# Patient Record
Sex: Male | Born: 1960 | Race: White | Hispanic: No | Marital: Single | Smoking: Never smoker
Health system: Southern US, Community
[De-identification: ages and names within clinical notes are randomized; demographics above are authoritative.]

## PROBLEM LIST (undated history)

## (undated) DIAGNOSIS — I10 Essential (primary) hypertension: Secondary | ICD-10-CM

## (undated) DIAGNOSIS — E119 Type 2 diabetes mellitus without complications: Secondary | ICD-10-CM

## (undated) DIAGNOSIS — E7800 Pure hypercholesterolemia, unspecified: Secondary | ICD-10-CM

## (undated) HISTORY — PX: OTHER SURGICAL HISTORY: SHX169

---

## 2010-09-07 ENCOUNTER — Observation Stay: Payer: Self-pay | Admitting: Internal Medicine

## 2013-05-08 ENCOUNTER — Emergency Department: Payer: Self-pay | Admitting: Emergency Medicine

## 2013-05-08 LAB — BASIC METABOLIC PANEL
ANION GAP: 7 (ref 7–16)
BUN: 15 mg/dL (ref 7–18)
CO2: 27 mmol/L (ref 21–32)
Calcium, Total: 8.7 mg/dL (ref 8.5–10.1)
Chloride: 103 mmol/L (ref 98–107)
Creatinine: 1.4 mg/dL — ABNORMAL HIGH (ref 0.60–1.30)
EGFR (Non-African Amer.): 57 — ABNORMAL LOW
GLUCOSE: 181 mg/dL — AB (ref 65–99)
Osmolality: 279 (ref 275–301)
POTASSIUM: 3.9 mmol/L (ref 3.5–5.1)
SODIUM: 137 mmol/L (ref 136–145)

## 2013-05-08 LAB — TROPONIN I

## 2013-05-08 LAB — CBC WITH DIFFERENTIAL/PLATELET
Basophil #: 0.1 10*3/uL (ref 0.0–0.1)
Basophil %: 1 %
EOS ABS: 0.3 10*3/uL (ref 0.0–0.7)
Eosinophil %: 4.4 %
HCT: 45.3 % (ref 40.0–52.0)
HGB: 14.9 g/dL (ref 13.0–18.0)
LYMPHS ABS: 1.6 10*3/uL (ref 1.0–3.6)
LYMPHS PCT: 27.1 %
MCH: 29.1 pg (ref 26.0–34.0)
MCHC: 32.9 g/dL (ref 32.0–36.0)
MCV: 88 fL (ref 80–100)
MONOS PCT: 9 %
Monocyte #: 0.5 x10 3/mm (ref 0.2–1.0)
Neutrophil #: 3.4 10*3/uL (ref 1.4–6.5)
Neutrophil %: 58.5 %
PLATELETS: 194 10*3/uL (ref 150–440)
RBC: 5.12 10*6/uL (ref 4.40–5.90)
RDW: 15.3 % — ABNORMAL HIGH (ref 11.5–14.5)
WBC: 5.8 10*3/uL (ref 3.8–10.6)

## 2013-05-08 LAB — URINALYSIS, COMPLETE
Bacteria: NONE SEEN
Bilirubin,UR: NEGATIVE
Blood: NEGATIVE
Glucose,UR: NEGATIVE mg/dL (ref 0–75)
LEUKOCYTE ESTERASE: NEGATIVE
NITRITE: NEGATIVE
PH: 5 (ref 4.5–8.0)
Protein: NEGATIVE
RBC, UR: NONE SEEN /HPF (ref 0–5)
Specific Gravity: 1.023 (ref 1.003–1.030)
Squamous Epithelial: NONE SEEN
WBC UR: NONE SEEN /HPF (ref 0–5)

## 2017-03-13 ENCOUNTER — Encounter: Payer: Self-pay | Admitting: Emergency Medicine

## 2017-03-13 ENCOUNTER — Emergency Department: Payer: Self-pay

## 2017-03-13 ENCOUNTER — Other Ambulatory Visit: Payer: Self-pay

## 2017-03-13 ENCOUNTER — Observation Stay
Admission: EM | Admit: 2017-03-13 | Discharge: 2017-03-16 | Disposition: A | Discharge status: Home / Self Care | Payer: Self-pay | Attending: Internal Medicine | Admitting: Internal Medicine

## 2017-03-13 DIAGNOSIS — E1165 Type 2 diabetes mellitus with hyperglycemia: Secondary | ICD-10-CM | POA: Insufficient documentation

## 2017-03-13 DIAGNOSIS — I200 Unstable angina: Principal | ICD-10-CM | POA: Diagnosis present

## 2017-03-13 DIAGNOSIS — E7800 Pure hypercholesterolemia, unspecified: Secondary | ICD-10-CM | POA: Insufficient documentation

## 2017-03-13 DIAGNOSIS — I10 Essential (primary) hypertension: Secondary | ICD-10-CM | POA: Insufficient documentation

## 2017-03-13 DIAGNOSIS — R079 Chest pain, unspecified: Secondary | ICD-10-CM

## 2017-03-13 DIAGNOSIS — E785 Hyperlipidemia, unspecified: Secondary | ICD-10-CM | POA: Insufficient documentation

## 2017-03-13 DIAGNOSIS — Z7982 Long term (current) use of aspirin: Secondary | ICD-10-CM | POA: Insufficient documentation

## 2017-03-13 DIAGNOSIS — E860 Dehydration: Secondary | ICD-10-CM | POA: Insufficient documentation

## 2017-03-13 DIAGNOSIS — F329 Major depressive disorder, single episode, unspecified: Secondary | ICD-10-CM | POA: Insufficient documentation

## 2017-03-13 DIAGNOSIS — Z79899 Other long term (current) drug therapy: Secondary | ICD-10-CM | POA: Insufficient documentation

## 2017-03-13 DIAGNOSIS — Z794 Long term (current) use of insulin: Secondary | ICD-10-CM | POA: Insufficient documentation

## 2017-03-13 HISTORY — DX: Essential (primary) hypertension: I10

## 2017-03-13 HISTORY — DX: Type 2 diabetes mellitus without complications: E11.9

## 2017-03-13 HISTORY — DX: Pure hypercholesterolemia, unspecified: E78.00

## 2017-03-13 LAB — BASIC METABOLIC PANEL
ANION GAP: 17 — AB (ref 5–15)
BUN: 19 mg/dL (ref 6–20)
CO2: 22 mmol/L (ref 22–32)
Calcium: 9 mg/dL (ref 8.9–10.3)
Chloride: 95 mmol/L — ABNORMAL LOW (ref 101–111)
Creatinine, Ser: 1.14 mg/dL (ref 0.61–1.24)
GFR calc Af Amer: >60 mL/min (ref >60)
GFR calc non Af Amer: >60 mL/min (ref >60)
GLUCOSE: 321 mg/dL — AB (ref 65–99)
POTASSIUM: 3.8 mmol/L (ref 3.5–5.1)
Sodium: 134 mmol/L — ABNORMAL LOW (ref 135–145)

## 2017-03-13 LAB — CBC
HEMATOCRIT: 45.4 % (ref 40.0–52.0)
HEMOGLOBIN: 15.2 g/dL (ref 13.0–18.0)
MCH: 28.7 pg (ref 26.0–34.0)
MCHC: 33.5 g/dL (ref 32.0–36.0)
MCV: 85.6 fL (ref 80.0–100.0)
Platelets: 193 10*3/uL (ref 150–440)
RBC: 5.3 MIL/uL (ref 4.40–5.90)
RDW: 14.6 % — ABNORMAL HIGH (ref 11.5–14.5)
WBC: 6.4 10*3/uL (ref 3.8–10.6)

## 2017-03-13 LAB — URINALYSIS, COMPLETE (UACMP) WITH MICROSCOPIC
BACTERIA UA: NONE SEEN
Bilirubin Urine: NEGATIVE
Glucose, UA: >=500 mg/dL — AB
HGB URINE DIPSTICK: NEGATIVE
KETONES UR: 20 mg/dL — AB
LEUKOCYTES UA: NEGATIVE
Nitrite: NEGATIVE
PH: 6 (ref 5.0–8.0)
Protein, ur: NEGATIVE mg/dL
Specific Gravity, Urine: 1.032 — ABNORMAL HIGH (ref 1.005–1.030)
Squamous Epithelial / LPF: NONE SEEN

## 2017-03-13 LAB — BRAIN NATRIURETIC PEPTIDE: B NATRIURETIC PEPTIDE 5: 11 pg/mL (ref 0.0–100.0)

## 2017-03-13 LAB — BETA-HYDROXYBUTYRIC ACID: Beta-Hydroxybutyric Acid: 0.85 mmol/L — ABNORMAL HIGH (ref 0.05–0.27)

## 2017-03-13 LAB — TROPONIN I
Troponin I: <0.03 ng/mL (ref <0.03)
Troponin I: <0.03 ng/mL (ref <0.03)

## 2017-03-13 MED ORDER — IOPAMIDOL (ISOVUE-370) INJECTION 76%
75.0000 mL | Freq: Once | INTRAVENOUS | Status: AC | PRN
  Administered 2017-03-13: 75 mL via INTRAVENOUS

## 2017-03-13 MED ORDER — ONDANSETRON 4 MG PO TBDP
4.0000 mg | ORAL_TABLET | Freq: Once | ORAL | Status: AC | PRN
  Administered 2017-03-13: 4 mg via ORAL
  Filled 2017-03-13: qty 1

## 2017-03-13 MED ORDER — SODIUM CHLORIDE 0.9 % IV BOLUS (SEPSIS)
1000.0000 mL | Freq: Once | INTRAVENOUS | Status: AC
  Administered 2017-03-13: 1000 mL via INTRAVENOUS

## 2017-03-13 MED ORDER — ONDANSETRON HCL 4 MG/2ML IJ SOLN
4.0000 mg | Freq: Once | INTRAMUSCULAR | Status: AC
  Administered 2017-03-13: 4 mg via INTRAVENOUS
  Filled 2017-03-13: qty 2

## 2017-03-13 MED ORDER — MORPHINE SULFATE (PF) 4 MG/ML IV SOLN
4.0000 mg | Freq: Once | INTRAVENOUS | Status: AC
Start: 2017-03-13 — End: 2017-03-13
  Administered 2017-03-13: 4 mg via INTRAVENOUS
  Filled 2017-03-13: qty 1

## 2017-03-13 MED ORDER — MORPHINE SULFATE (PF) 4 MG/ML IV SOLN
4.0000 mg | Freq: Once | INTRAVENOUS | Status: AC
  Administered 2017-03-13: 4 mg via INTRAVENOUS
  Filled 2017-03-13: qty 1

## 2017-03-13 NOTE — ED Triage Notes (Signed)
Pt to triage via WC, reports CP and SOB x 1 day, described as pressure, w/ dizziness, nausea.  Pt report hx of HTN, DM, and high cholesterol,.

## 2017-03-13 NOTE — ED Provider Notes (Signed)
[facility] Emergency Department Provider Note  ____________________________________________   First MD Initiated Contact with Patient 03/13/17 1902     (approximate)  I have reviewed the triage vital signs and the nursing notes.   HISTORY  Chief Complaint Chest Pain   HPI Randy Murray is a 57 y.o. male with a history of diabetes as well as hypertension was presenting to the emergency department today with chest pain that he says started suddenly at 2 PM this afternoon.  He says the pain is a 10 out of 10 and across the front of his chest and feels like a pressure.  He denies any radiation of the pain.  Says that it is associated with shortness of breath.  Says that the pain is been constant.  Not worsened with deep breathing.  Says that he is also been out of his insulin lately.  Denies any cardiac disease.  Patient denies fever.  Says that he does feel like he has a sore throat.  Does not report body aches.   Past Medical History:  Diagnosis Date  . Diabetes mellitus without complication (HCC)   . Hypercholesterolemia   . Hypertension     There are no active problems to display for this patient.   History reviewed. No pertinent surgical history.  Prior to Admission medications   Medication Sig Start Date End Date Taking? Authorizing Provider  aspirin EC 81 MG tablet Take 81 mg by mouth. 12/24/15  Yes [provider]  omeprazole (PRILOSEC) 40 MG capsule Take 40 mg by mouth daily. 02/20/17  Yes [provider]  CYMBALTA 60 MG capsule Take 60 mg by mouth daily. 01/14/17   [provider]  gabapentin (NEURONTIN) 800 MG tablet Take 800 mg by mouth 3 (three) times daily. 01/14/17   [provider]  hydrochlorothiazide (HYDRODIURIL) 25 MG tablet Take 25 mg by mouth daily. 01/14/17   [provider]  hydrOXYzine (ATARAX/VISTARIL) 25 MG tablet Take 1 tablet by mouth every 6 (six) hours as needed. 01/14/17    [provider]  losartan (COZAAR) 25 MG tablet Take 25 mg by mouth daily. 01/14/17   [provider]  lovastatin (MEVACOR) 20 MG tablet Take 1 tablet by mouth daily. 01/14/17   [provider]  metFORMIN (GLUCOPHAGE) 1000 MG tablet Take 1,000 mg by mouth 2 (two) times daily with a meal. 01/14/17   [provider]  PROVENTIL HFA 108 (90 Base) MCG/ACT inhaler Inhale 2 puffs into the lungs every 6 (six) hours as needed. 02/24/17   [provider]    Allergies Lisinopril  History reviewed. No pertinent family history.  Social History Social History   Tobacco Use  . Smoking status: Never Smoker  . Smokeless tobacco: Never Used  Substance Use Topics  . Alcohol use: Yes  . Drug use: Not on file    Review of Systems  Constitutional: No fever/chills Eyes: No visual changes. ENT: No sore throat. Cardiovascular: As above Respiratory: As above Gastrointestinal: No abdominal pain.  No nausea, no vomiting.  No diarrhea.  No constipation. Genitourinary: Negative for dysuria. Musculoskeletal: Negative for back pain. Skin: Negative for rash. Neurological: Negative for headaches, focal weakness or numbness.   ____________________________________________   PHYSICAL EXAM:  VITAL SIGNS: ED Triage Vitals  Enc Vitals Group     BP 03/13/17 1559 (!) 156/85     Pulse Rate 03/13/17 1559 (!) 124     Resp 03/13/17 1559 20     Temp  03/13/17 1559 97.6 F (36.4 C)     Temp Source 03/13/17 1559 Oral     SpO2 03/13/17 1559 100 %     Weight 03/13/17 1600 283 lb (128.4 kg)     Height 03/13/17 1600 6\' 1"  (1.854 m)     Head Circumference --      Peak Flow --      Pain Score 03/13/17 1559 10     Pain Loc --      Pain Edu? --      Excl. in GC? --     Constitutional: Alert and oriented.  Patient appears uncomfortable. Eyes: Conjunctivae are normal.  Head: Atraumatic. Nose: No congestion/rhinnorhea. Mouth/Throat: Mucous membranes are moist.  No  erythema to the throat.  No tonsillar exudates. Neck: No stridor.   Cardiovascular: Tachycardic, regular rhythm. Grossly normal heart sounds.  Good peripheral circulation. Respiratory: Normal respiratory effort.  No retractions. Lungs CTAB. Gastrointestinal: Soft and nontender. No distention. No CVA tenderness. Musculoskeletal: No lower extremity tenderness nor edema.   Neurologic:  Normal speech and language. No gross focal neurologic deficits are appreciated. Skin:  Skin is warm, dry and intact. No rash noted. Psychiatric: Mood and affect are normal. Speech and behavior are normal.  ____________________________________________   LABS (all labs ordered are listed, but only abnormal results are displayed)  Labs Reviewed  BASIC METABOLIC PANEL - Abnormal; Notable for the following components:      Result Value   Sodium 134 (*)    Chloride 95 (*)    Glucose, Bld 321 (*)    Anion gap 17 (*)    All other components within normal limits  CBC - Abnormal; Notable for the following components:   RDW 14.6 (*)    All other components within normal limits  BETA-HYDROXYBUTYRIC ACID - Abnormal; Notable for the following components:   Beta-Hydroxybutyric Acid 0.85 (*)    All other components within normal limits  URINALYSIS, COMPLETE (UACMP) WITH MICROSCOPIC - Abnormal; Notable for the following components:   Color, Urine YELLOW (*)    APPearance CLEAR (*)    Specific Gravity, Urine 1.032 (*)    Glucose, UA >=500 (*)    Ketones, ur 20 (*)    All other components within normal limits  TROPONIN I  BRAIN NATRIURETIC PEPTIDE  TROPONIN I   ____________________________________________  EKG  ED ECG REPORT I, Arelia Longest, the attending physician, personally viewed and interpreted this ECG.   Date: 03/13/2017  EKG Time: 1557  Rate: 129  Rhythm: sinus tachycardia  Axis: normal  Intervals:none  ST&T Change: No ST segment elevation or depression.  No abnormal T wave  inversion.  ____________________________________________  RADIOLOGY  No acute disease on the chest x-ray.  Mild changes of interstitial pulmonary edema on the CT angiography. ____________________________________________   PROCEDURES  Procedure(s) performed:   Procedures  Critical Care performed:   ____________________________________________   INITIAL IMPRESSION / ASSESSMENT AND PLAN / ED COURSE  Pertinent labs & imaging results that were available during my care of the patient were reviewed by me and considered in my medical decision making (see chart for details).  Differential diagnosis includes, but is not limited to, ACS, aortic dissection, pulmonary embolism, cardiac tamponade, pneumothorax, pneumonia, pericarditis, myocarditis, GI-related causes including esophagitis/gastritis, and musculoskeletal chest wall pain.   As part of my medical decision making, I reviewed the following data within the electronic MEDICAL RECORD NUMBER Notes from prior ED visits  ----------------------------------------- 10 PM on 03/13/2017 ----------------------------------------- Patient pending lab  work at this time.  CT angiography reassuring without evidence of PE.  Possible diagnoses to include diabetic ketoacidosis.  Cardiac related chest pain.  Signed out to Dr. Cyril Loosen.        ____________________________________________   FINAL CLINICAL IMPRESSION(S) / ED DIAGNOSES  Chest pain.  Hyperglycemia.    NEW MEDICATIONS STARTED DURING THIS VISIT:  New Prescriptions   No medications on file     Note:  This document was prepared using Dragon voice recognition software and may include unintentional dictation errors.     Myrna Blazer, MD 03/13/17 [phone]

## 2017-03-14 ENCOUNTER — Observation Stay
Admit: 2017-03-14 | Discharge: 2017-03-14 | Disposition: A | Discharge status: Home / Self Care | Payer: Self-pay | Attending: Internal Medicine | Admitting: Internal Medicine

## 2017-03-14 ENCOUNTER — Encounter: Payer: Self-pay | Admitting: Internal Medicine

## 2017-03-14 ENCOUNTER — Other Ambulatory Visit: Payer: Self-pay

## 2017-03-14 DIAGNOSIS — I200 Unstable angina: Secondary | ICD-10-CM

## 2017-03-14 LAB — TROPONIN I: Troponin I: <0.03 ng/mL (ref <0.03)

## 2017-03-14 LAB — ECHOCARDIOGRAM COMPLETE
HEIGHTINCHES: 73 in
Weight: 4358.4 oz

## 2017-03-14 LAB — GLUCOSE, CAPILLARY
GLUCOSE-CAPILLARY: 261 mg/dL — AB (ref 65–99)
GLUCOSE-CAPILLARY: 258 mg/dL — AB (ref 65–99)
Glucose-Capillary: 229 mg/dL — ABNORMAL HIGH (ref 65–99)
Glucose-Capillary: 311 mg/dL — ABNORMAL HIGH (ref 65–99)
Glucose-Capillary: 250 mg/dL — ABNORMAL HIGH (ref 65–99)

## 2017-03-14 MED ORDER — ASPIRIN 81 MG PO CHEW
162.0000 mg | CHEWABLE_TABLET | Freq: Once | ORAL | Status: AC
Start: 2017-03-14 — End: 2017-03-14
  Administered 2017-03-14: 162 mg via ORAL
  Filled 2017-03-14: qty 2

## 2017-03-14 MED ORDER — ONDANSETRON HCL 4 MG/2ML IJ SOLN: 4.0000 mg | Freq: Four times a day (QID) | INTRAMUSCULAR | Status: DC | PRN

## 2017-03-14 MED ORDER — METFORMIN HCL 500 MG PO TABS
1000.0000 mg | ORAL_TABLET | Freq: Two times a day (BID) | ORAL | Status: DC
  Administered 2017-03-14 – 2017-03-16 (×5): 1000 mg via ORAL
  Filled 2017-03-14 (×5): qty 2

## 2017-03-14 MED ORDER — ENOXAPARIN SODIUM 40 MG/0.4ML ~~LOC~~ SOLN
40.0000 mg | SUBCUTANEOUS | Status: DC
  Administered 2017-03-14 – 2017-03-16 (×3): 40 mg via SUBCUTANEOUS
  Filled 2017-03-14 (×3): qty 0.4

## 2017-03-14 MED ORDER — ACETAMINOPHEN 325 MG PO TABS
650.0000 mg | ORAL_TABLET | ORAL | Status: DC | PRN
  Administered 2017-03-14: 650 mg via ORAL
  Filled 2017-03-14: qty 2

## 2017-03-14 MED ORDER — PRAVASTATIN SODIUM 20 MG PO TABS
20.0000 mg | ORAL_TABLET | Freq: Every day | ORAL | Status: DC
  Administered 2017-03-14 – 2017-03-15 (×2): 20 mg via ORAL
  Filled 2017-03-14 (×2): qty 1

## 2017-03-14 MED ORDER — SODIUM CHLORIDE 0.9 % IV SOLN
INTRAVENOUS | Status: DC
  Administered 2017-03-14: 03:00:00 via INTRAVENOUS

## 2017-03-14 MED ORDER — LOSARTAN POTASSIUM 25 MG PO TABS
25.0000 mg | ORAL_TABLET | Freq: Every day | ORAL | Status: DC
  Administered 2017-03-14 – 2017-03-16 (×3): 25 mg via ORAL
  Filled 2017-03-14 (×3): qty 1

## 2017-03-14 MED ORDER — PANTOPRAZOLE SODIUM 40 MG PO TBEC
40.0000 mg | DELAYED_RELEASE_TABLET | Freq: Every day | ORAL | Status: DC
  Administered 2017-03-14 – 2017-03-16 (×3): 40 mg via ORAL
  Filled 2017-03-14 (×3): qty 1

## 2017-03-14 MED ORDER — ASPIRIN 81 MG PO CHEW: 324.0000 mg | CHEWABLE_TABLET | ORAL | Status: AC

## 2017-03-14 MED ORDER — ASPIRIN EC 81 MG PO TBEC
81.0000 mg | DELAYED_RELEASE_TABLET | Freq: Every day | ORAL | Status: DC
  Filled 2017-03-14: qty 1

## 2017-03-14 MED ORDER — ALBUTEROL SULFATE (2.5 MG/3ML) 0.083% IN NEBU: 2.5000 mg | INHALATION_SOLUTION | Freq: Four times a day (QID) | RESPIRATORY_TRACT | Status: DC | PRN

## 2017-03-14 MED ORDER — HYDROXYZINE HCL 25 MG PO TABS
25.0000 mg | ORAL_TABLET | Freq: Four times a day (QID) | ORAL | Status: DC | PRN
  Administered 2017-03-14 – 2017-03-16 (×5): 25 mg via ORAL
  Filled 2017-03-14 (×5): qty 1

## 2017-03-14 MED ORDER — ASPIRIN 300 MG RE SUPP: 300.0000 mg | RECTAL | Status: AC

## 2017-03-14 MED ORDER — INSULIN ASPART 100 UNIT/ML ~~LOC~~ SOLN
0.0000 [IU] | Freq: Three times a day (TID) | SUBCUTANEOUS | Status: DC
  Administered 2017-03-14: 11 [IU] via SUBCUTANEOUS
  Administered 2017-03-14: 15 [IU] via SUBCUTANEOUS
  Administered 2017-03-14: 7 [IU] via SUBCUTANEOUS
  Administered 2017-03-15: 11 [IU] via SUBCUTANEOUS
  Administered 2017-03-15 – 2017-03-16 (×3): 15 [IU] via SUBCUTANEOUS
  Filled 2017-03-14 (×7): qty 1

## 2017-03-14 MED ORDER — GABAPENTIN 400 MG PO CAPS
800.0000 mg | ORAL_CAPSULE | Freq: Three times a day (TID) | ORAL | Status: DC
  Administered 2017-03-14 – 2017-03-16 (×7): 800 mg via ORAL
  Filled 2017-03-14 (×7): qty 2

## 2017-03-14 MED ORDER — MORPHINE SULFATE (PF) 2 MG/ML IV SOLN
2.0000 mg | INTRAVENOUS | Status: DC | PRN
  Administered 2017-03-14 – 2017-03-16 (×13): 2 mg via INTRAVENOUS
  Filled 2017-03-14 (×13): qty 1

## 2017-03-14 MED ORDER — DULOXETINE HCL 30 MG PO CPEP
60.0000 mg | ORAL_CAPSULE | Freq: Every day | ORAL | Status: DC
  Administered 2017-03-14 – 2017-03-16 (×3): 60 mg via ORAL
  Filled 2017-03-14 (×3): qty 2

## 2017-03-14 MED ORDER — GABAPENTIN 800 MG PO TABS
800.0000 mg | ORAL_TABLET | Freq: Three times a day (TID) | ORAL | Status: DC
  Filled 2017-03-14 (×2): qty 1

## 2017-03-14 MED ORDER — INSULIN ASPART 100 UNIT/ML ~~LOC~~ SOLN
0.0000 [IU] | Freq: Every day | SUBCUTANEOUS | Status: DC
  Administered 2017-03-14: 2 [IU] via SUBCUTANEOUS
  Administered 2017-03-14: 3 [IU] via SUBCUTANEOUS
  Administered 2017-03-15: 2 [IU] via SUBCUTANEOUS
  Filled 2017-03-14 (×3): qty 1

## 2017-03-14 MED ORDER — INSULIN ASPART 100 UNIT/ML ~~LOC~~ SOLN
3.0000 [IU] | Freq: Three times a day (TID) | SUBCUTANEOUS | Status: DC
  Administered 2017-03-14 – 2017-03-16 (×7): 3 [IU] via SUBCUTANEOUS
  Filled 2017-03-14 (×7): qty 1

## 2017-03-14 MED ORDER — ASPIRIN EC 81 MG PO TBEC
81.0000 mg | DELAYED_RELEASE_TABLET | Freq: Every day | ORAL | Status: DC
  Administered 2017-03-14 – 2017-03-16 (×3): 81 mg via ORAL
  Filled 2017-03-14 (×3): qty 1

## 2017-03-14 MED ORDER — PERFLUTREN LIPID MICROSPHERE
1.0000 mL | INTRAVENOUS | Status: AC | PRN
  Administered 2017-03-14: 6 mL via INTRAVENOUS
  Filled 2017-03-14: qty 10

## 2017-03-14 MED ORDER — NITROGLYCERIN 0.4 MG SL SUBL
0.4000 mg | SUBLINGUAL_TABLET | SUBLINGUAL | Status: DC | PRN
  Administered 2017-03-14: 0.4 mg via SUBLINGUAL
  Filled 2017-03-14 (×2): qty 1

## 2017-03-14 MED ORDER — NITROGLYCERIN 0.4 MG SL SUBL
0.4000 mg | SUBLINGUAL_TABLET | SUBLINGUAL | Status: DC | PRN
  Administered 2017-03-14: 0.4 mg via SUBLINGUAL

## 2017-03-14 NOTE — Plan of Care (Signed)
  Progressing Education: Knowledge of General Education information will improve 03/14/2017 0646 - Progressing by Stefan Churchogers, Masin Shatto M, RN Health Behavior/Discharge Planning: Ability to manage health-related needs will improve 03/14/2017 0646 - Progressing by Stefan Churchogers, Velmer Woelfel M, RN Clinical Measurements: Ability to maintain clinical measurements within normal limits will improve 03/14/2017 0646 - Progressing by Stefan Churchogers, Cesar Alf M, RN Diagnostic test results will improve 03/14/2017 [phone] - Progressing by Stefan Churchogers, Deira Shimer M, RN Respiratory complications will improve 03/14/2017 0646 - Progressing by Stefan Churchogers, Tanazia Achee M, RN Cardiovascular complication will be avoided 03/14/2017 0646 - Progressing by Stefan Churchogers, Derisha Funderburke M, RN Pain Managment: General experience of comfort will improve 03/14/2017 0646 - Progressing by Stefan Churchogers, Terika Pillard M, RN

## 2017-03-14 NOTE — Progress Notes (Signed)
*  PRELIMINARY RESULTS* Echocardiogram 2D Echocardiogram has been performed. Definity IV Contrast used on this study.  Garrel Ridgelikeshia S Casanova Schurman 03/14/2017, 11:40 AM

## 2017-03-14 NOTE — H&P (Signed)
[facility] [place] [facility]   PATIENT NAME: Randy LukeJeffery Murray    MR#:  [id number]  DATE OF BIRTH:  10/29/1960  DATE OF ADMISSION:  03/13/2017  PRIMARY CARE PHYSICIAN: System, Pcp Not In   REQUESTING/REFERRING PHYSICIAN:   CHIEF COMPLAINT:   Chief Complaint  Patient presents with  . Chest Pain    HISTORY OF PRESENT ILLNESS: Randy Murray  is a 57 y.o. male with a known history of hypertension, hyperlipidemia, type 2 diabetes mellitus presented to the emergency room with chest pain since yesterday evening.  Patient had initially chest pain in the left side of the chest , then later has pain in the whole of the chest.  Pain is 7 out of 10 on a scale of 1-10 and sharp in nature.  Troponin has been negative in the emergency room.  Patient also ran out of his insulin medications for diabetes since Christmas.  His blood sugar has been elevated.  No complaints of any shortness of breath, orthopnea.  No headaches dizziness and blurry vision.Marland Kitchen.  Hospitalist service was consulted for further care.  PAST MEDICAL HISTORY:   Past Medical History:  Diagnosis Date  . Diabetes mellitus without complication (HCC)   . Hypercholesterolemia   . Hypertension     PAST SURGICAL HISTORY:  Past Surgical History:  Procedure Laterality Date  . none      SOCIAL HISTORY:  Social History   Tobacco Use  . Smoking status: Never Smoker  . Smokeless tobacco: Never Used  Substance Use Topics  . Alcohol use: Yes    FAMILY HISTORY:  Family History  Problem Relation Age of Onset  . Thyroid cancer Mother   . Breast cancer Mother   . Esophageal cancer Father     DRUG ALLERGIES:  Allergies  Allergen Reactions  . Lisinopril     REVIEW OF SYSTEMS:   CONSTITUTIONAL: No fever, fatigue or weakness.  EYES: No blurred or double vision.  EARS, NOSE, AND THROAT: No tinnitus or ear pain.  RESPIRATORY: No cough, shortness of breath, wheezing or hemoptysis.  CARDIOVASCULAR:  Has chest pain,  No orthopnea, edema.  GASTROINTESTINAL: No nausea, vomiting, diarrhea or abdominal pain.  GENITOURINARY: No dysuria, hematuria.  ENDOCRINE: Has polyuria, nocturia,  HEMATOLOGY: No anemia, easy bruising or bleeding SKIN: No rash or lesion. MUSCULOSKELETAL: No joint pain or arthritis.   NEUROLOGIC: No tingling, numbness, weakness.  PSYCHIATRY: No anxiety or depression.   MEDICATIONS AT HOME:  Prior to Admission medications   Medication Sig Start Date End Date Taking? Authorizing Provider  aspirin EC 81 MG tablet Take 81 mg by mouth. 12/24/15  Yes [provider]  CYMBALTA 60 MG capsule Take 60 mg by mouth daily. 01/14/17  Yes [provider]  fluticasone (FLOVENT HFA) 110 MCG/ACT inhaler Inhale 2 puffs into the lungs 2 (two) times daily as needed.   Yes [provider]  gabapentin (NEURONTIN) 800 MG tablet Take 800 mg by mouth 3 (three) times daily. 01/14/17  Yes [provider]  glimepiride (AMARYL) 4 MG tablet Take 4 mg by mouth 2 (two) times daily. 09/21/16  Yes [provider]  hydrochlorothiazide (HYDRODIURIL) 25 MG tablet Take 25 mg by mouth daily. 01/14/17  Yes [provider]  hydrOXYzine (ATARAX/VISTARIL) 25 MG tablet Take 1 tablet by mouth every 6 (six) hours as needed. 01/14/17  Yes [provider]  Insulin Glargine (LANTUS SOLOSTAR) 100 UNIT/ML Solostar Pen Inject 65 Units into the skin 2 (two) times  daily. 02/25/17 04/26/17 Yes [provider]  losartan (COZAAR) 25 MG tablet Take 25 mg by mouth daily. 01/14/17  Yes [provider]  lovastatin (MEVACOR) 20 MG tablet Take 1 tablet by mouth daily. 01/14/17  Yes [provider]  metFORMIN (GLUCOPHAGE) 1000 MG tablet Take 1,000 mg by mouth 2 (two) times daily with a meal. 01/14/17  Yes [provider]  omeprazole (PRILOSEC) 40 MG capsule Take 40 mg by mouth daily. 02/20/17  Yes [provider]  PROVENTIL HFA 108 (90 Base)  MCG/ACT inhaler Inhale 2 puffs into the lungs every 6 (six) hours as needed. 02/24/17  Yes [provider]  traZODone (DESYREL) 50 MG tablet Take 50 mg by mouth at bedtime as needed for sleep.   Yes [provider]      PHYSICAL EXAMINATION:   VITAL SIGNS: Blood pressure 115/67, pulse 96, temperature 97.6 F (36.4 C), temperature source Oral, resp. rate 15, height 6\' 1"  (1.854 m), weight 128.4 kg (283 lb), SpO2 94 %.  GENERAL:  57 y.o.-year-old patient lying in the bed with no acute distress.  EYES: Pupils equal, round, reactive to light and accommodation. No scleral icterus. Extraocular muscles intact.  HEENT: Head atraumatic, normocephalic. Oropharynx dry and nasopharynx clear.  NECK:  Supple, no jugular venous distention. No thyroid enlargement, no tenderness.  LUNGS: Normal breath sounds bilaterally, no wheezing, rales,rhonchi or crepitation. No use of accessory muscles of respiration.  CARDIOVASCULAR: S1, S2 normal. No murmurs, rubs, or gallops.  ABDOMEN: Soft, nontender, nondistended. Bowel sounds present. No organomegaly or mass.  EXTREMITIES: No pedal edema, cyanosis, or clubbing.  NEUROLOGIC: Cranial nerves II through XII are intact. Muscle strength 5/5 in all extremities. Sensation intact. Gait not checked.  PSYCHIATRIC: The patient is alert and oriented x 3.  SKIN: No obvious rash, lesion, or ulcer.   LABORATORY PANEL:   CBC Recent Labs  Lab 03/13/17 1602  WBC 6.4  HGB 15.2  HCT 45.4  PLT 193  MCV 85.6  MCH 28.7  MCHC 33.5  RDW 14.6*   ------------------------------------------------------------------------------------------------------------------  Chemistries  Recent Labs  Lab 03/13/17 1602  NA 134*  K 3.8  CL 95*  CO2 22  GLUCOSE 321*  BUN 19  CREATININE 1.14  CALCIUM 9.0   ------------------------------------------------------------------------------------------------------------------ estimated creatinine clearance is 99.2 mL/min  (by C-G formula based on SCr of 1.14 mg/dL). ------------------------------------------------------------------------------------------------------------------ No results for input(s): TSH, T4TOTAL, T3FREE, THYROIDAB in the last 72 hours.  Invalid input(s): FREET3   Coagulation profile No results for input(s): INR, PROTIME in the last 168 hours. ------------------------------------------------------------------------------------------------------------------- No results for input(s): DDIMER in the last 72 hours. -------------------------------------------------------------------------------------------------------------------  Cardiac Enzymes Recent Labs  Lab 03/13/17 1602 03/13/17 2223  TROPONINI <0.03 <0.03   ------------------------------------------------------------------------------------------------------------------ Invalid input(s): POCBNP  ---------------------------------------------------------------------------------------------------------------  Urinalysis    Component Value Date/Time   COLORURINE YELLOW (A) 03/13/2017 2129   APPEARANCEUR CLEAR (A) 03/13/2017 2129   APPEARANCEUR Clear 05/08/2013 2059   LABSPEC 1.032 (H) 03/13/2017 2129   LABSPEC 1.023 05/08/2013 2059   PHURINE 6.0 03/13/2017 2129   GLUCOSEU >=500 (A) 03/13/2017 2129   GLUCOSEU Negative 05/08/2013 2059   HGBUR NEGATIVE 03/13/2017 2129   BILIRUBINUR NEGATIVE 03/13/2017 2129   BILIRUBINUR Negative 05/08/2013 2059   KETONESUR 20 (A) 03/13/2017 2129   PROTEINUR NEGATIVE 03/13/2017 2129   NITRITE NEGATIVE 03/13/2017 2129   LEUKOCYTESUR NEGATIVE 03/13/2017 2129   LEUKOCYTESUR Negative 05/08/2013 2059     RADIOLOGY: Dg Chest 2 View  Result Date: 03/13/2017 CLINICAL DATA:  Chest  pain and shortness of breath. EXAM: CHEST  2 VIEW COMPARISON:  11/09/2013 FINDINGS: Cardiomediastinal silhouette is normal. Mediastinal contours appear intact. There is no evidence of focal airspace consolidation,  pleural effusion or pneumothorax. Osseous structures are without acute abnormality. Soft tissues are grossly normal. IMPRESSION: No active cardiopulmonary disease. Electronically Signed   By: Ted Mcalpine M.D.   On: 03/13/2017 17:50   Ct Angio Chest Pe W And/or Wo Contrast  Result Date: 03/13/2017 CLINICAL DATA:  CP and SOB x 1 day, described as pressure, w/ dizziness, nausea. Pt report hx of HTN, DM, and high cholesterol, EXAM: CT ANGIOGRAPHY CHEST WITH CONTRAST TECHNIQUE: Multidetector CT imaging of the chest was performed using the standard protocol during bolus administration of intravenous contrast. Multiplanar CT image reconstructions and MIPs were obtained to evaluate the vascular anatomy. CONTRAST:  75mL ISOVUE-370 IOPAMIDOL (ISOVUE-370) INJECTION 76% COMPARISON:  03/13/2017 chest x-ray and 06/19/2013 chest CT FINDINGS: Cardiovascular: Satisfactory opacification of the pulmonary arteries to the segmental level. No evidence of pulmonary embolism. Normal heart size. No pericardial effusion. Mediastinum/Nodes: Thyroid is normal in appearance. No mediastinal, hilar, or axillary adenopathy. Esophagus is normal in appearance. Lungs/Pleura: No consolidations, or pleural effusions. There is mild smooth septal thickening distributed along the dependent aspects of the lung, and associated with mild parenchymal ground-glass density, consistent with mild pulmonary edema. Small calcified granulomata are identified within the lungs. Upper Abdomen: The liver is diffusely low attenuation. Prominent caudate lobe. A left renal cyst is partially imaged. Gallbladder is present. There is a 1.9 centimeter benign lipoma within the neck of the pancreas. Musculoskeletal: Significant midthoracic degenerative changes. No evidence for acute abnormality. Review of the MIP images confirms the above findings. IMPRESSION: 1. Mild changes of interstitial pulmonary edema without pleural effusions or alveolar infiltrates. 2.  Technically adequate exam showing no acute pulmonary embolus. 3. Hepatic steatosis. Morphology of the liver compatible with cirrhosis. 4. Left renal cyst. 5. Benign pancreatic lipoma. Electronically Signed   By: Norva Pavlov M.D.   On: 03/13/2017 21:24    EKG: Orders placed or performed during the hospital encounter of 03/13/17  . EKG 12-Lead  . EKG 12-Lead  . ED EKG within 10 minutes  . ED EKG within 10 minutes    IMPRESSION AND PLAN: 57 year old male patient with history of diabetes mellitus type 2, hypertension, hyperlipidemia presented to the emergency room with chest pain.  Admitting diagnosis 1.  Unstable angina 2.  Uncontrolled diabetes mellitus 3.  Dehydration 4.  Hypertension 5.  Hyperlipidemia 6.  Hepatic steatosis Treatment plan Admit patient to telemetry observation bed Start patient on aspirin as needed nitrates for chest pain IV fluids Sliding scale coverage for diabetes mellitus Cardiac stress test to rule out ischemia Cycle troponin and check echocardiogram Cardiology consultation   All the records are reviewed and case discussed with ED provider. Management plans discussed with the patient, family and they are in agreement.  CODE STATUS: Full code Code Status History    This patient does not have a recorded code status. Please follow your organizational policy for patients in this situation.       TOTAL TIME TAKING CARE OF THIS PATIENT:53 minutes.    Ihor Austin M.D on 03/14/2017 at 12:53 AM  Between 7am to 6pm - Pager - [phone]  After 6pm go to [email or web address] - password [email or web address]  [facility]  Office  [phone]  CC: Primary care physician; System, Pcp Not In

## 2017-03-14 NOTE — Consult Note (Signed)
Cardiology Consultation:   Patient ID: Randy Murray; [id number]; [birth year]   Admit date: 03/13/2017 Date of Consult: 03/14/2017  Primary Care Provider: System, Pcp Not In Primary Cardiologist: No primary care provider on file. none Primary Electrophysiologist:  none   Patient Profile:   Randy Murray is a 57 y.o. male with a hx of diabetes and HTN who is being seen today for the evaluation of chest pain at the request of Dr. Tobi Bastos  History of Present Illness:   Randy Murray is an obese 57 year old man with diabetes, hypertension, and dyslipidemia, who presented to the hospital with chest pain associated with diaphoresis.  No shortness of breath.  He has nausea with a symptoms.  He was treated in the emergency room with nitroglycerin and morphine with relief of his symptoms.  He has ruled out for MI.  Initial EKGs were notable for sinus tachycardia with poor R wave progression.  The patient's symptoms have resolved.  The patient also complains of severe dizziness when he stands up.  This resolves after a couple of minutes.  He denies any change in his diet or medications.  Past Medical History:  Diagnosis Date  . Diabetes mellitus without complication (HCC)   . Hypercholesterolemia   . Hypertension     Past Surgical History:  Procedure Laterality Date  . none       Home Medications:  Prior to Admission medications   Medication Sig Start Date End Date Taking? Authorizing Provider  aspirin EC 81 MG tablet Take 81 mg by mouth. 12/24/15  Yes [provider]  CYMBALTA 60 MG capsule Take 60 mg by mouth daily. 01/14/17  Yes [provider]  fluticasone (FLOVENT HFA) 110 MCG/ACT inhaler Inhale 2 puffs into the lungs 2 (two) times daily as needed.   Yes [provider]  gabapentin (NEURONTIN) 800 MG tablet Take 800 mg by mouth 3 (three) times daily. 01/14/17  Yes [provider]  glimepiride (AMARYL) 4 MG tablet Take 4 mg by mouth 2 (two) times  daily. 09/21/16  Yes [provider]  hydrochlorothiazide (HYDRODIURIL) 25 MG tablet Take 25 mg by mouth daily. 01/14/17  Yes [provider]  hydrOXYzine (ATARAX/VISTARIL) 25 MG tablet Take 1 tablet by mouth every 6 (six) hours as needed. 01/14/17  Yes [provider]  Insulin Glargine (LANTUS SOLOSTAR) 100 UNIT/ML Solostar Pen Inject 65 Units into the skin 2 (two) times daily. 02/25/17 04/26/17 Yes [provider]  losartan (COZAAR) 25 MG tablet Take 25 mg by mouth daily. 01/14/17  Yes [provider]  lovastatin (MEVACOR) 20 MG tablet Take 1 tablet by mouth daily. 01/14/17  Yes [provider]  metFORMIN (GLUCOPHAGE) 1000 MG tablet Take 1,000 mg by mouth 2 (two) times daily with a meal. 01/14/17  Yes [provider]  omeprazole (PRILOSEC) 40 MG capsule Take 40 mg by mouth daily. 02/20/17  Yes [provider]  PROVENTIL HFA 108 (90 Base) MCG/ACT inhaler Inhale 2 puffs into the lungs every 6 (six) hours as needed. 02/24/17  Yes [provider]  traZODone (DESYREL) 50 MG tablet Take 50 mg by mouth at bedtime as needed for sleep.   Yes [provider]    Inpatient Medications: Scheduled Meds: . aspirin  324 mg Oral NOW   Or  . aspirin  300 mg Rectal NOW  . aspirin EC  81 mg Oral Daily  . [START ON 03/15/2017] aspirin EC  81 mg Oral Daily  . DULoxetine  60  mg Oral Daily  . enoxaparin (LOVENOX) injection  40 mg Subcutaneous Q24H  . gabapentin  800 mg Oral TID  . insulin aspart  0-20 Units Subcutaneous TID WC  . insulin aspart  0-5 Units Subcutaneous QHS  . insulin aspart  3 Units Subcutaneous TID WC  . losartan  25 mg Oral Daily  . metFORMIN  1,000 mg Oral BID WC  . pantoprazole  40 mg Oral Daily  . pravastatin  20 mg Oral q1800   Continuous Infusions:  PRN Meds: acetaminophen, albuterol, hydrOXYzine, morphine injection, nitroGLYCERIN, nitroGLYCERIN, ondansetron (ZOFRAN) IV  Allergies:    Allergies    Allergen Reactions  . Lisinopril     Social History:   Social History   Socioeconomic History  . Marital status: Single    Spouse name: Not on file  . Number of children: Not on file  . Years of education: Not on file  . Highest education level: Not on file  Social Needs  . Financial resource strain: Not on file  . Food insecurity - worry: Not on file  . Food insecurity - inability: Not on file  . Transportation needs - medical: Not on file  . Transportation needs - non-medical: Not on file  Occupational History    Employer: [employer]  Tobacco Use  . Smoking status: Never Smoker  . Smokeless tobacco: Never Used  Substance and Sexual Activity  . Alcohol use: Yes  . Drug use: Not on file  . Sexual activity: Yes  Other Topics Concern  . Not on file  Social History Narrative  . Not on file    Family History:    Family History  Problem Relation Age of Onset  . Thyroid cancer Mother   . Breast cancer Mother   . Esophageal cancer Father      ROS:  Please see the history of present illness.   All other ROS reviewed and negative.     Physical Exam/Data:   Vitals:   03/13/17 2333 03/14/17 0000 03/14/17 0126 03/14/17 0807  BP: 129/81 115/67 (!) 147/85 (!) 144/85  Pulse: (!) 105 96 95 83  Resp: 15  18 18   Temp:   (!) 97.3 F (36.3 C) 97.6 F (36.4 C)  TempSrc:   Oral Oral  SpO2: 97% 94% 98% 97%  Weight:   272 lb 6.4 oz (123.6 kg)   Height:        Intake/Output Summary (Last 24 hours) at 03/14/2017 1251 Last data filed at 03/14/2017 1017 Gross per 24 hour  Intake 485 ml  Output 1200 ml  Net -715 ml   Filed Weights   03/13/17 1600 03/14/17 0126  Weight: 283 lb (128.4 kg) 272 lb 6.4 oz (123.6 kg)   Body mass index is 35.94 kg/m.  General:  diskempt appearing Well nourished, well developed, in no acute distress HEENT: normal Lymph: no adenopathy Neck: no JVD Endocrine:  No thryomegaly Vascular: No carotid bruits; FA pulses 2+ bilaterally  without bruits  Cardiac:  normal S1, S2; RRR; no murmur, soft S4 Lungs:  clear to auscultation bilaterally, no wheezing, rhonchi or rales  Abd: soft, nontender, no hepatomegaly  Ext: no edema Musculoskeletal:  No deformities, BUE and BLE strength normal and equal Skin: warm and dry  Neuro:  CNs 2-12 intact, no focal abnormalities noted Psych:  Normal affect   EKG:  The EKG was personally reviewed and demonstrates:  Sinus tachycardia Telemetry:  Telemetry was personally reviewed and demonstrates:  NSR  Relevant  CV Studies: Echo pending  Laboratory Data:  Chemistry Recent Labs  Lab 03/13/17 1602  NA 134*  K 3.8  CL 95*  CO2 22  GLUCOSE 321*  BUN 19  CREATININE 1.14  CALCIUM 9.0  GFRNONAA >60  GFRAA >60  ANIONGAP 17*    No results for input(s): PROT, ALBUMIN, AST, ALT, ALKPHOS, BILITOT in the last 168 hours. Hematology Recent Labs  Lab 03/13/17 1602  WBC 6.4  RBC 5.30  HGB 15.2  HCT 45.4  MCV 85.6  MCH 28.7  MCHC 33.5  RDW 14.6*  PLT 193   Cardiac Enzymes Recent Labs  Lab 03/13/17 1602 03/13/17 2223 03/14/17 0445 03/14/17 1050  TROPONINI <0.03 <0.03 <0.03 <0.03   No results for input(s): TROPIPOC in the last 168 hours.  BNP Recent Labs  Lab 03/13/17 1602  BNP 11.0    DDimer No results for input(s): DDIMER in the last 168 hours.  Radiology/Studies:  Dg Chest 2 View  Result Date: 03/13/2017 CLINICAL DATA:  Chest pain and shortness of breath. EXAM: CHEST  2 VIEW COMPARISON:  11/09/2013 FINDINGS: Cardiomediastinal silhouette is normal. Mediastinal contours appear intact. There is no evidence of focal airspace consolidation, pleural effusion or pneumothorax. Osseous structures are without acute abnormality. Soft tissues are grossly normal. IMPRESSION: No active cardiopulmonary disease. Electronically Signed   By: Ted Mcalpine M.D.   On: 03/13/2017 17:50   Ct Angio Chest Pe W And/or Wo Contrast  Result Date: 03/13/2017 CLINICAL DATA:  CP and SOB x  1 day, described as pressure, w/ dizziness, nausea. Pt report hx of HTN, DM, and high cholesterol, EXAM: CT ANGIOGRAPHY CHEST WITH CONTRAST TECHNIQUE: Multidetector CT imaging of the chest was performed using the standard protocol during bolus administration of intravenous contrast. Multiplanar CT image reconstructions and MIPs were obtained to evaluate the vascular anatomy. CONTRAST:  75mL ISOVUE-370 IOPAMIDOL (ISOVUE-370) INJECTION 76% COMPARISON:  03/13/2017 chest x-ray and 06/19/2013 chest CT FINDINGS: Cardiovascular: Satisfactory opacification of the pulmonary arteries to the segmental level. No evidence of pulmonary embolism. Normal heart size. No pericardial effusion. Mediastinum/Nodes: Thyroid is normal in appearance. No mediastinal, hilar, or axillary adenopathy. Esophagus is normal in appearance. Lungs/Pleura: No consolidations, or pleural effusions. There is mild smooth septal thickening distributed along the dependent aspects of the lung, and associated with mild parenchymal ground-glass density, consistent with mild pulmonary edema. Small calcified granulomata are identified within the lungs. Upper Abdomen: The liver is diffusely low attenuation. Prominent caudate lobe. A left renal cyst is partially imaged. Gallbladder is present. There is a 1.9 centimeter benign lipoma within the neck of the pancreas. Musculoskeletal: Significant midthoracic degenerative changes. No evidence for acute abnormality. Review of the MIP images confirms the above findings. IMPRESSION: 1. Mild changes of interstitial pulmonary edema without pleural effusions or alveolar infiltrates. 2. Technically adequate exam showing no acute pulmonary embolus. 3. Hepatic steatosis. Morphology of the liver compatible with cirrhosis. 4. Left renal cyst. 5. Benign pancreatic lipoma. Electronically Signed   By: Norva Pavlov M.D.   On: 03/13/2017 21:24    Assessment and Plan:   1. Chest pain -his symptoms are fairly atypical and he  has cardiac markers are negative and his EKG demonstrates no acute findings.  He does have multiple cardiac risk factors.  Exercise stress testing would be reasonable and we would anticipate proceeding with this tomorrow unless his 2D echo demonstrates left ventricular dysfunction.  If this were the case, left heart catheterization would be recommended. 2. Dizziness -he appears to have  some orthostasis.  The patient takes a diuretic, hydrochlorothiazide as an outpatient, and would consider discontinuation of this medication. 3. Obesity -he will need to work on weight loss.  This would of course help his diabetes and hypertension.   For questions or updates, please contact [facility] Please consult [email or web address] for contact info under Cardiology/STEMI.   Signed, Lewayne Bunting, MD  03/14/2017 12:51 PM

## 2017-03-14 NOTE — ED Provider Notes (Signed)
Care signed over from Dr. Pershing ProudSchaevitz.  Briefly the patient is a 57 year old man with diabetes and dyslipidemia who comes to the emergency department with pressure-like substernal chest pain radiating to bilateral arms.  He is persistently tachycardic.  I appreciate 2 troponins are negative but I am concerned he is having unstable angina.  We will give him aspirin, nitroglycerin, and admit him to the hospital now.  I discussed the patient who verbalized understanding and agreed with the plan.  I then discussed with the hospitalist who is graciously agreed to admit the patient to her service.  CRITICAL CARE Performed by: Merrily BrittleNeil Ader Fritze   Total critical care time: 30 minutes  Critical care time was exclusive of separately billable procedures and treating other patients.  Critical care was necessary to treat or prevent imminent or life-threatening deterioration.  Critical care was time spent personally by me on the following activities: development of treatment plan with patient and/or surrogate as well as nursing, discussions with consultants, evaluation of patient's response to treatment, examination of patient, obtaining history from patient or surrogate, ordering and performing treatments and interventions, ordering and review of laboratory studies, ordering and review of radiographic studies, pulse oximetry and re-evaluation of patient's condition.    Merrily Brittleifenbark, Barabara Motz, MD 03/14/17 [phone]

## 2017-03-14 NOTE — Progress Notes (Signed)
Pt arrived via stretcher to floor. Pt A&O.  Telemetry monitor applied and called to CCMD. Oriented to room, call bell, ascom. Booklet given.

## 2017-03-14 NOTE — Progress Notes (Signed)
[facility] [place] [facility]   PATIENT NAME: Randy Murray    MR#:  [id number]  DATE OF BIRTH:  11/20/1960  SUBJECTIVE:  CHIEF COMPLAINT:   Chief Complaint  Patient presents with  . Chest Pain   Came with chest pain, has multiple risk factors. Pain still continues. Has also complained of orthostatic dizziness  REVIEW OF SYSTEMS:  CONSTITUTIONAL: No fever, fatigue or weakness.  EYES: No blurred or double vision.  EARS, NOSE, AND THROAT: No tinnitus or ear pain.  RESPIRATORY: No cough, shortness of breath, wheezing or hemoptysis.  CARDIOVASCULAR: No chest pain, orthopnea, edema.  GASTROINTESTINAL: No nausea, vomiting, diarrhea or abdominal pain.  GENITOURINARY: No dysuria, hematuria.  ENDOCRINE: No polyuria, nocturia,  HEMATOLOGY: No anemia, easy bruising or bleeding SKIN: No rash or lesion. MUSCULOSKELETAL: No joint pain or arthritis.   NEUROLOGIC: No tingling, numbness, weakness.  PSYCHIATRY: No anxiety or depression.   ROS  DRUG ALLERGIES:   Allergies  Allergen Reactions  . Lisinopril     VITALS:  Blood pressure (!) 144/85, pulse 83, temperature 97.6 F (36.4 C), temperature source Oral, resp. rate 18, height 6\' 1"  (1.854 m), weight 123.6 kg (272 lb 6.4 oz), SpO2 97 %.  PHYSICAL EXAMINATION:  GENERAL:  57 y.o.-year-old patient lying in the bed with no acute distress.  EYES: Pupils equal, round, reactive to light and accommodation. No scleral icterus. Extraocular muscles intact.  HEENT: Head atraumatic, normocephalic. Oropharynx and nasopharynx clear.  NECK:  Supple, no jugular venous distention. No thyroid enlargement, no tenderness.  LUNGS: Normal breath sounds bilaterally, no wheezing, rales,rhonchi or crepitation. No use of accessory muscles of respiration.  CARDIOVASCULAR: S1, S2 normal. No murmurs, rubs, or gallops.  ABDOMEN: Soft, nontender, nondistended. Bowel sounds present. No organomegaly or mass.  EXTREMITIES: No pedal edema,  cyanosis, or clubbing.  NEUROLOGIC: Cranial nerves II through XII are intact. Muscle strength 5/5 in all extremities. Sensation intact. Gait not checked.  PSYCHIATRIC: The patient is alert and oriented x 3.  SKIN: No obvious rash, lesion, or ulcer.   Physical Exam LABORATORY PANEL:   CBC Recent Labs  Lab 03/13/17 1602  WBC 6.4  HGB 15.2  HCT 45.4  PLT 193   ------------------------------------------------------------------------------------------------------------------  Chemistries  Recent Labs  Lab 03/13/17 1602  NA 134*  K 3.8  CL 95*  CO2 22  GLUCOSE 321*  BUN 19  CREATININE 1.14  CALCIUM 9.0   ------------------------------------------------------------------------------------------------------------------  Cardiac Enzymes Recent Labs  Lab 03/14/17 0445 03/14/17 1050  TROPONINI <0.03 <0.03   ------------------------------------------------------------------------------------------------------------------  RADIOLOGY:  Dg Chest 2 View  Result Date: 03/13/2017 CLINICAL DATA:  Chest pain and shortness of breath. EXAM: CHEST  2 VIEW COMPARISON:  11/09/2013 FINDINGS: Cardiomediastinal silhouette is normal. Mediastinal contours appear intact. There is no evidence of focal airspace consolidation, pleural effusion or pneumothorax. Osseous structures are without acute abnormality. Soft tissues are grossly normal. IMPRESSION: No active cardiopulmonary disease. Electronically Signed   By: Ted Mcalpineobrinka  Dimitrova M.D.   On: 03/13/2017 17:50   Ct Angio Chest Pe W And/or Wo Contrast  Result Date: 03/13/2017 CLINICAL DATA:  CP and SOB x 1 day, described as pressure, w/ dizziness, nausea. Pt report hx of HTN, DM, and high cholesterol, EXAM: CT ANGIOGRAPHY CHEST WITH CONTRAST TECHNIQUE: Multidetector CT imaging of the chest was performed using the standard protocol during bolus administration of intravenous contrast. Multiplanar CT image reconstructions and MIPs were obtained to  evaluate the vascular anatomy. CONTRAST:  75mL ISOVUE-370 IOPAMIDOL (ISOVUE-370) INJECTION  76% COMPARISON:  03/13/2017 chest x-ray and 06/19/2013 chest CT FINDINGS: Cardiovascular: Satisfactory opacification of the pulmonary arteries to the segmental level. No evidence of pulmonary embolism. Normal heart size. No pericardial effusion. Mediastinum/Nodes: Thyroid is normal in appearance. No mediastinal, hilar, or axillary adenopathy. Esophagus is normal in appearance. Lungs/Pleura: No consolidations, or pleural effusions. There is mild smooth septal thickening distributed along the dependent aspects of the lung, and associated with mild parenchymal ground-glass density, consistent with mild pulmonary edema. Small calcified granulomata are identified within the lungs. Upper Abdomen: The liver is diffusely low attenuation. Prominent caudate lobe. A left renal cyst is partially imaged. Gallbladder is present. There is a 1.9 centimeter benign lipoma within the neck of the pancreas. Musculoskeletal: Significant midthoracic degenerative changes. No evidence for acute abnormality. Review of the MIP images confirms the above findings. IMPRESSION: 1. Mild changes of interstitial pulmonary edema without pleural effusions or alveolar infiltrates. 2. Technically adequate exam showing no acute pulmonary embolus. 3. Hepatic steatosis. Morphology of the liver compatible with cirrhosis. 4. Left renal cyst. 5. Benign pancreatic lipoma. Electronically Signed   By: Norva Pavlov M.D.   On: 03/13/2017 21:24    ASSESSMENT AND PLAN:   Active Problems:   Unstable angina (HCC)  * Unstable angina   Also multiple risk factors.   3 troponins negative, monitor on telemetry. Echocardiogram being done today.   Cardiologist consult appreciated, suggested to have a nuclear stress test which could not be given today because of Sunday, will be done tomorrow.  * Dizziness   Suspected Orthostatic hypotension Patient has typical  symptoms of this, I would check orthostatic vitals and stop his diuretics on discharge if positive.  * Hypertension   Continue losartan.  * Diabetes   Continue metformin and insulin sliding scale coverage.  * Depression   Continue Cymbalta.   All the records are reviewed and case discussed with Care Management/Social Workerr. Management plans discussed with the patient, family and they are in agreement.  CODE STATUS: Full code.   TOTAL TIME TAKING CARE OF THIS PATIENT: 35 minutes.     POSSIBLE D/C IN 1-2 DAYS, DEPENDING ON CLINICAL CONDITION.   Altamese Dilling M.D on 03/14/2017   Between 7am to 6pm - Pager - [phone]  After 6pm go to [email or web address] - password [email or web address]  [facility]  Office  [phone]  CC: Primary care physician; System, Pcp Not In  Note: This dictation was prepared with Dragon dictation along with smaller phrase technology. Any transcriptional errors that result from this process are unintentional.

## 2017-03-14 NOTE — ED Notes (Signed)
Pt transported to 241

## 2017-03-15 ENCOUNTER — Observation Stay: Payer: Self-pay

## 2017-03-15 ENCOUNTER — Observation Stay (HOSPITAL_BASED_OUTPATIENT_CLINIC_OR_DEPARTMENT_OTHER): Payer: Self-pay

## 2017-03-15 DIAGNOSIS — I200 Unstable angina: Secondary | ICD-10-CM

## 2017-03-15 LAB — NM MYOCAR MULTI W/SPECT W/WALL MOTION / EF
CHL CUP NUCLEAR SRS: 1
CHL CUP NUCLEAR SSS: 0
CHL CUP RESTING HR STRESS: 86 {beats}/min
LV dias vol: 51 mL (ref 62–150)
LV sys vol: 18 mL
NUC STRESS TID: 0.97
Peak HR: 106 {beats}/min
Percent HR: 65 %
SDS: 0

## 2017-03-15 LAB — GLUCOSE, CAPILLARY
GLUCOSE-CAPILLARY: 308 mg/dL — AB (ref 65–99)
GLUCOSE-CAPILLARY: 210 mg/dL — AB (ref 65–99)
Glucose-Capillary: 262 mg/dL — ABNORMAL HIGH (ref 65–99)
Glucose-Capillary: 346 mg/dL — ABNORMAL HIGH (ref 65–99)

## 2017-03-15 LAB — INFLUENZA PANEL BY PCR (TYPE A & B)
Influenza A By PCR: NEGATIVE
Influenza B By PCR: NEGATIVE

## 2017-03-15 MED ORDER — TRAZODONE HCL 50 MG PO TABS: 50.0000 mg | ORAL_TABLET | Freq: Every evening | ORAL | Status: DC | PRN

## 2017-03-15 MED ORDER — TECHNETIUM TC 99M TETROFOSMIN IV KIT
32.1100 | PACK | Freq: Once | INTRAVENOUS | Status: AC | PRN
  Administered 2017-03-15: 32.11 via INTRAVENOUS

## 2017-03-15 MED ORDER — LORAZEPAM 2 MG/ML IJ SOLN
1.0000 mg | Freq: Once | INTRAMUSCULAR | Status: AC
  Administered 2017-03-15: 1 mg via INTRAVENOUS
  Filled 2017-03-15: qty 1

## 2017-03-15 MED ORDER — INSULIN DETEMIR 100 UNIT/ML ~~LOC~~ SOLN: 25.0000 [IU] | Freq: Two times a day (BID) | SUBCUTANEOUS | 1 refills | Status: AC

## 2017-03-15 MED ORDER — REGADENOSON 0.4 MG/5ML IV SOLN
0.4000 mg | Freq: Once | INTRAVENOUS | Status: AC
  Administered 2017-03-15: 0.4 mg via INTRAVENOUS

## 2017-03-15 MED ORDER — INSULIN GLARGINE 100 UNIT/ML ~~LOC~~ SOLN
25.0000 [IU] | Freq: Two times a day (BID) | SUBCUTANEOUS | Status: DC
  Administered 2017-03-15 – 2017-03-16 (×3): 25 [IU] via SUBCUTANEOUS
  Filled 2017-03-15 (×4): qty 0.25

## 2017-03-15 MED ORDER — TECHNETIUM TC 99M TETROFOSMIN IV KIT
14.1100 | PACK | Freq: Once | INTRAVENOUS | Status: AC | PRN
  Administered 2017-03-15: 14.11 via INTRAVENOUS

## 2017-03-15 MED ORDER — LORAZEPAM 2 MG/ML IJ SOLN: 0.5000 mg | Freq: Once | INTRAMUSCULAR | Status: DC

## 2017-03-15 NOTE — Progress Notes (Signed)
Patient would like one time scheduled dose of ativan before the MRI Brain. Will continue to monitor patient.

## 2017-03-15 NOTE — Progress Notes (Signed)
Patient complaining of shortness of breath with chest pain at 5 from 0-10. Patient stated "the morphine helps". Patient stated he did not want the nitroglycerin, just the morphine. Patient put on 2L of O2 for shortness of breath. Vital signs stable. Will continue to monitor patient.

## 2017-03-15 NOTE — Care Management (Signed)
CM consult as patient has stated he can not get his insulin. Patient receives his medications at [facility] [place]. He says that the pharmacy can not get in a shipment of Lantus.  Last time he checked was last week.  He receives his medications at no cost.  Spoke to the pharmacist and informed that patient last filled his Lantus was September.  Pharmacy has had patient's Lantus and there has been no problem obtaining the medication from the manufacturer.  Patient has not completed his annual review that has been need in order to continue to receive assist. He has not provided his income information which has been requested several times.   Informed patient and he says "oh, that's right."

## 2017-03-15 NOTE — Progress Notes (Addendum)
Patient states he is feeling much better. Back on room air with no more complaints of shortness of breath. 96% on room air. Will continue to monitor patient.

## 2017-03-15 NOTE — Care Management (Signed)
provided patient with one vial of Levemir insulin. Instructed patient to discuss further insulin with [facility].

## 2017-03-15 NOTE — Care Management (Addendum)
Spoke with [facility] about assist for one vial of levimer insulin until patient can get to [facility].  [facility] does not have Lantus.  Requested script from attending and having [facility] volunteer to pick up from clinic.  Discussed with unit staff that oxygen needs to be weaned and discontinued.  Patient says he is short of breath but his oxygen sats "have been fine."   He has ruled out for flu. Order for MRI present for vertigo.  Discussed with patient of need to make arrangements for transportation home this afternoon

## 2017-03-15 NOTE — Progress Notes (Signed)
Patient complaining of new onset of dizziness. MD notified with new orders to do orthostatic vital signs. Patient will also have an MR Brain and some ativan was ordered for anxiety before the test. Will continue to monitor patient.

## 2017-03-16 LAB — GLUCOSE, CAPILLARY: GLUCOSE-CAPILLARY: 306 mg/dL — AB (ref 65–99)

## 2017-03-16 LAB — HIV ANTIBODY (ROUTINE TESTING W REFLEX): HIV Screen 4th Generation wRfx: NONREACTIVE

## 2017-03-16 NOTE — Care Management (Signed)
Patient was to have discharged last pm.  There is no documentation present indicating medical necessity for continued stay

## 2017-03-16 NOTE — Discharge Summary (Signed)
[facility] -  at [facility]   PATIENT NAME: Randy Murray    MR#:  [id number]  DATE OF BIRTH:  09/12/1960  DATE OF ADMISSION:  03/13/2017 ADMITTING PHYSICIAN: Ihor AustinPavan Pyreddy, MD  DATE OF DISCHARGE: 03/16/2017  PRIMARY CARE PHYSICIAN: Abbott PaoBurgert, Woodward, MD    ADMISSION DIAGNOSIS:  Unstable angina (HCC) [I20.0] Chest pain, unspecified type [R07.9]  DISCHARGE DIAGNOSIS:  Active Problems:   Unstable angina (HCC)   SECONDARY DIAGNOSIS:   Past Medical History:  Diagnosis Date  . Diabetes mellitus without complication (HCC)   . Hypercholesterolemia   . Hypertension     HOSPITAL COURSE:   * Unstable angina   Also multiple risk factors.   3 troponins negative, monitor on telemetry. Echocardiogram being done today.   Cardiologist consult appreciated, suggested to have a nuclear stress test    It is negative.   CT chest angio on admission was negative for PE>  * Dizziness   Suspected Orthostatic hypotension Patient has typical symptoms of this, No drop in BP on orthostatic.   Hold HCTZ.  He had continued this feeling, SO I decided to do MRI brain to r/o lacunar stroke.   He may be able to go home, once MRI is done.   MRI was done late last night, so he stayed here.   No complains this morning, he can go home.    I counseled about medication compliance.  * Hypertension   Continue losartan.  * Diabetes   Continue metformin and insulin sliding scale coverage.   Added levemir prescriptions./  * Depression   Continue Cymbalta.    DISCHARGE CONDITIONS:   Stable.  CONSULTS OBTAINED:  Treatment Team:  Marinus Mawaylor, Gregg W, MD  DRUG ALLERGIES:   Allergies  Allergen Reactions  . Lisinopril     DISCHARGE MEDICATIONS:   Allergies as of 03/16/2017      Reactions   Lisinopril       Medication List    STOP taking these medications   hydrochlorothiazide 25 MG tablet Commonly known as:  HYDRODIURIL   LANTUS SOLOSTAR 100 UNIT/ML  Solostar Pen Generic drug:  Insulin Glargine     TAKE these medications   aspirin EC 81 MG tablet Take 81 mg by mouth.   CYMBALTA 60 MG capsule Generic drug:  DULoxetine Take 60 mg by mouth daily.   fluticasone 110 MCG/ACT inhaler Commonly known as:  FLOVENT HFA Inhale 2 puffs into the lungs 2 (two) times daily as needed.   gabapentin 800 MG tablet Commonly known as:  NEURONTIN Take 800 mg by mouth 3 (three) times daily.   glimepiride 4 MG tablet Commonly known as:  AMARYL Take 4 mg by mouth 2 (two) times daily.   hydrOXYzine 25 MG tablet Commonly known as:  ATARAX/VISTARIL Take 1 tablet by mouth every 6 (six) hours as needed.   insulin detemir 100 UNIT/ML injection Commonly known as:  LEVEMIR Inject 0.25 mLs (25 Units total) into the skin 2 (two) times daily.   losartan 25 MG tablet Commonly known as:  COZAAR Take 25 mg by mouth daily.   lovastatin 20 MG tablet Commonly known as:  MEVACOR Take 1 tablet by mouth daily.   metFORMIN 1000 MG tablet Commonly known as:  GLUCOPHAGE Take 1,000 mg by mouth 2 (two) times daily with a meal.   omeprazole 40 MG capsule Commonly known as:  PRILOSEC Take 40 mg by mouth daily.   PROVENTIL HFA 108 (90 Base) MCG/ACT inhaler Generic drug:  albuterol Inhale 2 puffs into the lungs every 6 (six) hours as needed.   traZODone 50 MG tablet Commonly known as:  DESYREL Take 50 mg by mouth at bedtime as needed for sleep.        DISCHARGE INSTRUCTIONS:    Follow with PMD and stay compliant with meds.  If you experience worsening of your admission symptoms, develop shortness of breath, life threatening emergency, suicidal or homicidal thoughts you must seek medical attention immediately by calling 911 or calling your MD immediately  if symptoms less severe.  You Must read complete instructions/literature along with all the possible adverse reactions/side effects for all the Medicines you take and that have been prescribed to  you. Take any new Medicines after you have completely understood and accept all the possible adverse reactions/side effects.   Please note  You were cared for by a hospitalist during your hospital stay. If you have any questions about your discharge medications or the care you received while you were in the hospital after you are discharged, you can call the unit and asked to speak with the hospitalist on call if the hospitalist that took care of you is not available. Once you are discharged, your primary care physician will handle any further medical issues. Please note that NO REFILLS for any discharge medications will be authorized once you are discharged, as it is imperative that you return to your primary care physician (or establish a relationship with a primary care physician if you do not have one) for your aftercare needs so that they can reassess your need for medications and monitor your lab values.    Today   CHIEF COMPLAINT:   Chief Complaint  Patient presents with  . Chest Pain    HISTORY OF PRESENT ILLNESS:  Randy Murray  is a 57 y.o. male with a known history of hypertension, hyperlipidemia, type 2 diabetes mellitus presented to the emergency room with chest pain since yesterday evening.  Patient had initially chest pain in the left side of the chest , then later has pain in the whole of the chest.  Pain is 7 out of 10 on a scale of 1-10 and sharp in nature.  Troponin has been negative in the emergency room.  Patient also ran out of his insulin medications for diabetes since Christmas.  His blood sugar has been elevated.  No complaints of any shortness of breath, orthopnea.  No headaches dizziness and blurry vision.Marland Kitchen  Hospitalist service was consulted for further care.    VITAL SIGNS:  Blood pressure (!) 143/82, pulse 78, temperature (!) 97.5 F (36.4 C), temperature source Oral, resp. rate 20, height 6\' 1"  (1.854 m), weight 127.2 kg (280 lb 8 oz), SpO2 96 %.  I/O:     Intake/Output Summary (Last 24 hours) at 03/16/2017 0842 Last data filed at 03/16/2017 0227 Gross per 24 hour  Intake 240 ml  Output 1275 ml  Net -1035 ml    PHYSICAL EXAMINATION:  GENERAL:  57 y.o.-year-old patient lying in the bed with no acute distress.  EYES: Pupils equal, round, reactive to light and accommodation. No scleral icterus. Extraocular muscles intact.  HEENT: Head atraumatic, normocephalic. Oropharynx and nasopharynx clear.  NECK:  Supple, no jugular venous distention. No thyroid enlargement, no tenderness.  LUNGS: Normal breath sounds bilaterally, no wheezing, rales,rhonchi or crepitation. No use of accessory muscles of respiration.  CARDIOVASCULAR: S1, S2 normal. No murmurs, rubs, or gallops.  ABDOMEN: Soft, non-tender, non-distended. Bowel sounds present. No organomegaly  or mass.  EXTREMITIES: No pedal edema, cyanosis, or clubbing.  NEUROLOGIC: Cranial nerves II through XII are intact. Muscle strength 5/5 in all extremities. Sensation intact. Gait not checked.  PSYCHIATRIC: The patient is alert and oriented x 3.  SKIN: No obvious rash, lesion, or ulcer.   DATA REVIEW:   CBC Recent Labs  Lab 03/13/17 1602  WBC 6.4  HGB 15.2  HCT 45.4  PLT 193    Chemistries  Recent Labs  Lab 03/13/17 1602  NA 134*  K 3.8  CL 95*  CO2 22  GLUCOSE 321*  BUN 19  CREATININE 1.14  CALCIUM 9.0    Cardiac Enzymes Recent Labs  Lab 03/14/17 1050  TROPONINI <0.03    Microbiology Results  No results found for this or any previous visit.  RADIOLOGY:  Mr Brain 83 Contrast  Result Date: 03/15/2017 CLINICAL DATA:  57 y/o  M; Vertigo, episodic, peripheral. EXAM: MRI HEAD WITHOUT CONTRAST TECHNIQUE: Multiplanar, multiecho pulse sequences of the brain and surrounding structures were obtained without intravenous contrast. COMPARISON:  05/08/2013 CT head.  08/04/2010 MRI head. FINDINGS: Brain: No acute infarction, hemorrhage, hydrocephalus, extra-axial collection or mass  lesion. Vascular: Normal flow voids. Skull and upper cervical spine: Normal marrow signal. Sinuses/Orbits: Negative. Other: Subcentimeter right parietal region dermal cyst, likely sebaceous cyst. IMPRESSION: No acute intracranial abnormality.  Normal MRI of the brain for age. Electronically Signed   By: Mitzi Hansen M.D.   On: 03/15/2017 21:45   Nm Myocar Multi W/spect W/wall Motion / Ef  Result Date: 03/15/2017  There was no ST segment deviation noted during stress.  No T wave inversion was noted during stress.  The study is normal.  This is a low risk study.  The left ventricular ejection fraction is normal (55-65%).  Suboptimal study due to intense GI uptake adjacent to the inferior wall. Anterior wall defect on attenuated corrected stress images seems to be artifact due to overcorrection.     EKG:   Orders placed or performed during the hospital encounter of 03/13/17  . EKG 12-Lead  . EKG 12-Lead  . ED EKG within 10 minutes  . ED EKG within 10 minutes  . EKG      Management plans discussed with the patient, family and they are in agreement.  CODE STATUS:     Code Status Orders  (From admission, onward)        Start     Ordered   03/14/17 0157  Full code  Continuous     03/14/17 0156    Code Status History    Date Active Date Inactive Code Status Order ID Comments User Context   This patient has a current code status but no historical code status.      TOTAL TIME TAKING CARE OF THIS PATIENT: 35 minutes.    Altamese Dilling M.D on 03/16/2017 at 8:42 AM  Between 7am to 6pm - Pager - [phone]  After 6pm go to [email or web address] - password [email or web address]  [facility]  Office  [phone]  CC: Primary care physician; Abbott Pao, MD   Note: This dictation was prepared with Dragon dictation along with smaller phrase technology. Any transcriptional errors that result from this process are unintentional.

## 2017-03-16 NOTE — Progress Notes (Signed)
Patient given discharge instructions. IV taken out and tele off. Patient verbalized understanding with no further questions.  Patient drove to the hospital so will be driving back home.

## 2017-03-16 NOTE — Plan of Care (Signed)
  Adequate for Discharge Education: Knowledge of General Education information will improve 03/16/2017 0745 - Adequate for Discharge by Erma HeritageAlejo Calderon, Jorrell Kuster, RN Health Behavior/Discharge Planning: Ability to manage health-related needs will improve 03/16/2017 0745 - Adequate for Discharge by Erma HeritageAlejo Calderon, Fynlee Rowlands, RN Clinical Measurements: Ability to maintain clinical measurements within normal limits will improve 03/16/2017 0745 - Adequate for Discharge by Weldon PickingAlejo Calderon, Manuella GhaziBerenice, RN Will remain free from infection 03/16/2017 0745 - Adequate for Discharge by Erma HeritageAlejo Calderon, Korra Christine, RN Diagnostic test results will improve 03/16/2017 0745 - Adequate for Discharge by Erma HeritageAlejo Calderon, Mattheo Swindle, RN Respiratory complications will improve 03/16/2017 0745 - Adequate for Discharge by Erma HeritageAlejo Calderon, Brendaly Townsel, RN Cardiovascular complication will be avoided 03/16/2017 0745 - Adequate for Discharge by Erma HeritageAlejo Calderon, Takiyah Bohnsack, RN Activity: Risk for activity intolerance will decrease 03/16/2017 0745 - Adequate for Discharge by Erma HeritageAlejo Calderon, Acy Orsak, RN Nutrition: Adequate nutrition will be maintained 03/16/2017 0745 - Adequate for Discharge by Erma HeritageAlejo Calderon, Sufian Ravi, RN Coping: Level of anxiety will decrease 03/16/2017 0745 - Adequate for Discharge by Erma HeritageAlejo Calderon, Jasmine Maceachern, RN Elimination: Will not experience complications related to bowel motility 03/16/2017 0745 - Adequate for Discharge by Weldon PickingAlejo Calderon, Manuella GhaziBerenice, RN Will not experience complications related to urinary retention 03/16/2017 0745 - Adequate for Discharge by Erma HeritageAlejo Calderon, Hadja Harral, RN Pain Managment: General experience of comfort will improve 03/16/2017 0745 - Adequate for Discharge by Erma HeritageAlejo Calderon, Mollye Guinta, RN Safety: Ability to remain free from injury will improve 03/16/2017 0745 - Adequate for Discharge by Erma HeritageAlejo Calderon, Destane Speas, RN Skin Integrity: Risk for impaired skin integrity will decrease 03/16/2017 0745 - Adequate for  Discharge by Erma HeritageAlejo Calderon, Joanne Salah, RN Activity: Ability to tolerate increased activity will improve 03/16/2017 0745 - Adequate for Discharge by Erma HeritageAlejo Calderon, Calene Paradiso, RN Cardiac: Ability to achieve and maintain adequate cardiovascular perfusion will improve 03/16/2017 0745 - Adequate for Discharge by Erma HeritageAlejo Calderon, Javonne Dorko, RN Health Behavior/Discharge Planning: Ability to safely manage health-related needs after discharge will improve 03/16/2017 0745 - Adequate for Discharge by Erma HeritageAlejo Calderon, Laney Bagshaw, RN

## 2017-03-16 NOTE — Plan of Care (Signed)
  Progressing Education: Knowledge of General Education information will improve 03/16/2017 0007 - Progressing by Dorna LeitzNesbitt, Aseret Hoffman M, RN Activity: Ability to tolerate increased activity will improve 03/16/2017 0007 - Progressing by Dorna LeitzNesbitt, Ileta Ofarrell M, RN Cardiac: Ability to achieve and maintain adequate cardiovascular perfusion will improve 03/16/2017 0007 - Progressing by Dorna LeitzNesbitt, Sharma Lawrance M, RN Health Behavior/Discharge Planning: Ability to safely manage health-related needs after discharge will improve 03/16/2017 0007 - Progressing by Dorna LeitzNesbitt, Laren Orama M, RN

## 2017-03-16 NOTE — Progress Notes (Signed)
[facility] -  at [facility]   PATIENT NAME: Randy Murray    MR#:  [id number]  DATE OF BIRTH:  1960-04-02  SUBJECTIVE:  CHIEF COMPLAINT:   Chief Complaint  Patient presents with  . Chest Pain   Came with chest pain, has multiple risk factors. His chest pain resolved, stress test done- negative. I have seen pt after stress test results in afternnon, he had complaind again about feeling dizzi and SOB on trying to get up.  REVIEW OF SYSTEMS:  CONSTITUTIONAL: No fever, fatigue or weakness.  EYES: No blurred or double vision.  EARS, NOSE, AND THROAT: No tinnitus or ear pain.  RESPIRATORY: No cough, shortness of breath, wheezing or hemoptysis.  CARDIOVASCULAR: No chest pain, orthopnea, edema.  GASTROINTESTINAL: No nausea, vomiting, diarrhea or abdominal pain.  GENITOURINARY: No dysuria, hematuria.  ENDOCRINE: No polyuria, nocturia,  HEMATOLOGY: No anemia, easy bruising or bleeding SKIN: No rash or lesion. MUSCULOSKELETAL: No joint pain or arthritis.   NEUROLOGIC: No tingling, numbness, weakness.  PSYCHIATRY: No anxiety or depression.   ROS  DRUG ALLERGIES:   Allergies  Allergen Reactions  . Lisinopril     VITALS:  Blood pressure (!) 143/82, pulse 78, temperature (!) 97.5 F (36.4 C), temperature source Oral, resp. rate 20, height 6\' 1"  (1.854 m), weight 127.2 kg (280 lb 8 oz), SpO2 96 %.  PHYSICAL EXAMINATION:  GENERAL:  57 y.o.-year-old patient lying in the bed with no acute distress.  EYES: Pupils equal, round, reactive to light and accommodation. No scleral icterus. Extraocular muscles intact.  HEENT: Head atraumatic, normocephalic. Oropharynx and nasopharynx clear.  NECK:  Supple, no jugular venous distention. No thyroid enlargement, no tenderness.  LUNGS: Normal breath sounds bilaterally, no wheezing, rales,rhonchi or crepitation. No use of accessory muscles of respiration.  CARDIOVASCULAR: S1, S2 normal. No murmurs, rubs, or gallops.   ABDOMEN: Soft, nontender, nondistended. Bowel sounds present. No organomegaly or mass.  EXTREMITIES: No pedal edema, cyanosis, or clubbing.  NEUROLOGIC: Cranial nerves II through XII are intact. Muscle strength 5/5 in all extremities. Sensation intact. Gait not checked.  PSYCHIATRIC: The patient is alert and oriented x 3.  SKIN: No obvious rash, lesion, or ulcer.   Physical Exam LABORATORY PANEL:   CBC Recent Labs  Lab 03/13/17 1602  WBC 6.4  HGB 15.2  HCT 45.4  PLT 193   ------------------------------------------------------------------------------------------------------------------  Chemistries  Recent Labs  Lab 03/13/17 1602  NA 134*  K 3.8  CL 95*  CO2 22  GLUCOSE 321*  BUN 19  CREATININE 1.14  CALCIUM 9.0   ------------------------------------------------------------------------------------------------------------------  Cardiac Enzymes Recent Labs  Lab 03/14/17 0445 03/14/17 1050  TROPONINI <0.03 <0.03   ------------------------------------------------------------------------------------------------------------------  RADIOLOGY:  Mr Brain Wo Contrast  Result Date: 03/15/2017 CLINICAL DATA:  57 y/o  M; Vertigo, episodic, peripheral. EXAM: MRI HEAD WITHOUT CONTRAST TECHNIQUE: Multiplanar, multiecho pulse sequences of the brain and surrounding structures were obtained without intravenous contrast. COMPARISON:  05/08/2013 CT head.  08/04/2010 MRI head. FINDINGS: Brain: No acute infarction, hemorrhage, hydrocephalus, extra-axial collection or mass lesion. Vascular: Normal flow voids. Skull and upper cervical spine: Normal marrow signal. Sinuses/Orbits: Negative. Other: Subcentimeter right parietal region dermal cyst, likely sebaceous cyst. IMPRESSION: No acute intracranial abnormality.  Normal MRI of the brain for age. Electronically Signed   By: Mitzi Hansen M.D.   On: 03/15/2017 21:45   Nm Myocar Multi W/spect W/wall Motion / Ef  Result Date:  03/15/2017  There was no ST segment deviation noted during stress.  No T wave inversion was noted during stress.  The study is normal.  This is a low risk study.  The left ventricular ejection fraction is normal (55-65%).  Suboptimal study due to intense GI uptake adjacent to the inferior wall. Anterior wall defect on attenuated corrected stress images seems to be artifact due to overcorrection.     ASSESSMENT AND PLAN:   Active Problems:   Unstable angina (HCC)  * Unstable angina   Also multiple risk factors.   3 troponins negative, monitor on telemetry. Echocardiogram being done today.   Cardiologist consult appreciated, suggested to have a nuclear stress test    It is negative.   CT chest angio on admission was negative for PE>  * Dizziness   Suspected Orthostatic hypotension Patient has typical symptoms of this, No drop in BP on orthostatic.   Hold HCTZ.  He had continued this feeling, SO I decided to do MRI brain to r/o lacunar stroke.   He may be able to go home, once MRI is done.  * Hypertension   Continue losartan.  * Diabetes   Continue metformin and insulin sliding scale coverage.   Added levemir prescriptions./  * Depression   Continue Cymbalta.   All the records are reviewed and case discussed with Care Management/Social Workerr. Management plans discussed with the patient, family and they are in agreement.  CODE STATUS: Full code.   TOTAL TIME TAKING CARE OF THIS PATIENT: 35 minutes.    POSSIBLE D/C IN 1-2 DAYS, DEPENDING ON CLINICAL CONDITION.   Altamese DillingVaibhavkumar Shayma Pfefferle M.D on 03/16/2017   Between 7am to 6pm - Pager - [phone]  After 6pm go to [email or web address] - password [email or web address]  [facility]  Office  [phone]  CC: Primary care physician; Abbott PaoBurgert, Woodward, MD  Note: This dictation was prepared with Dragon dictation along with smaller phrase technology. Any transcriptional errors that result from this process are  unintentional.

## 2019-05-10 IMAGING — CT CT ANGIO CHEST
2 of 6 series · 18 of 46 positions shown · IV contrast (APPLIED)
Comparison: 03/13/2017 chest x-ray and 06/19/2013 chest CT

CLINICAL DATA: CP and SOB x 1 day, described as pressure, w/
dizziness, nausea. Pt report hx of HTN, DM, and high cholesterol,

EXAM:
CT ANGIOGRAPHY CHEST WITH CONTRAST
TECHNIQUE: Multidetector CT imaging of the chest was performed using the
standard protocol during bolus administration of intravenous
contrast. Multiplanar CT image reconstructions and MIPs were
obtained to evaluate the vascular anatomy.
CONTRAST:  75mL JIMIWY-9XD IOPAMIDOL (JIMIWY-9XD) INJECTION 76%

[Series 5: thins · axial · 0.79mm/px · z∈[-311,-44]mm · 15 of 293 slices shown]
[im 13/293  lung]
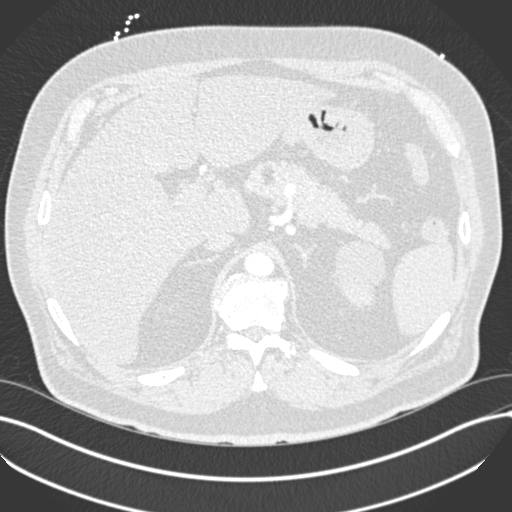
[im 39/293  soft-tissue]
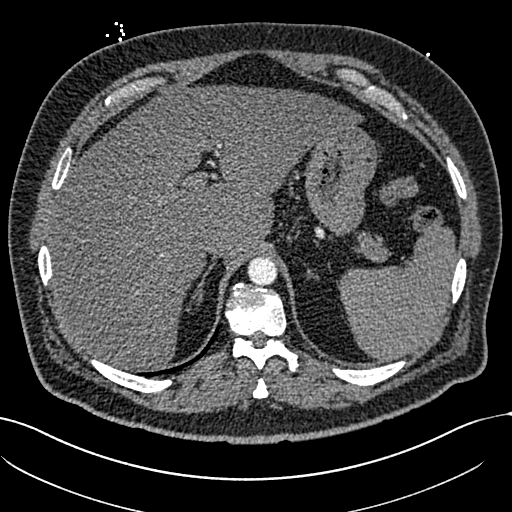
[im 51/293  lung]
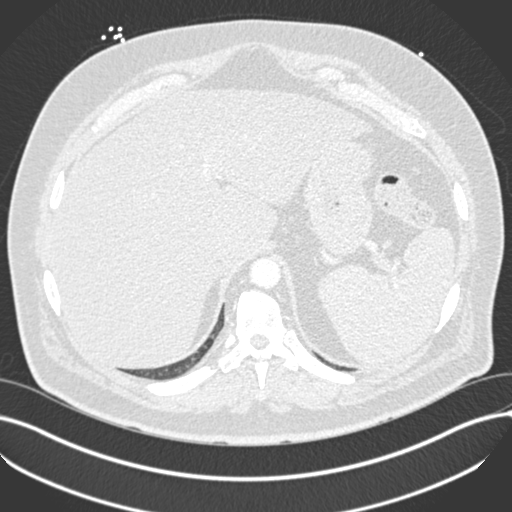
[im 77/293  soft-tissue]
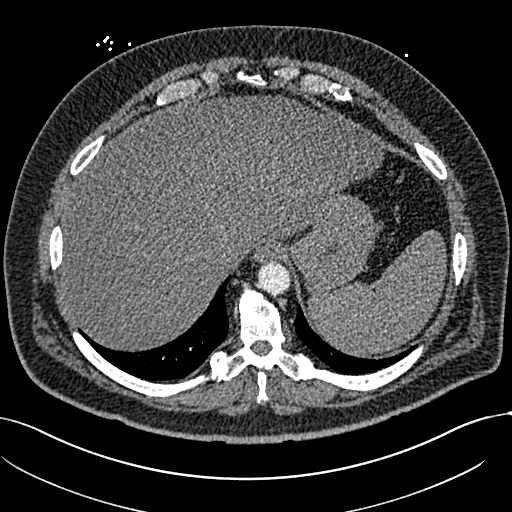
[im 89/293  lung]
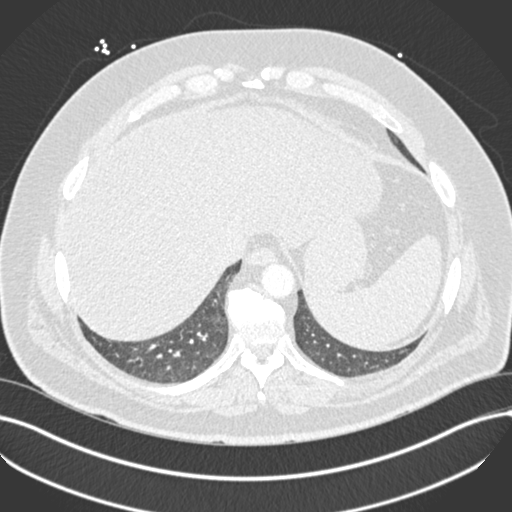
[im 115/293  soft-tissue]
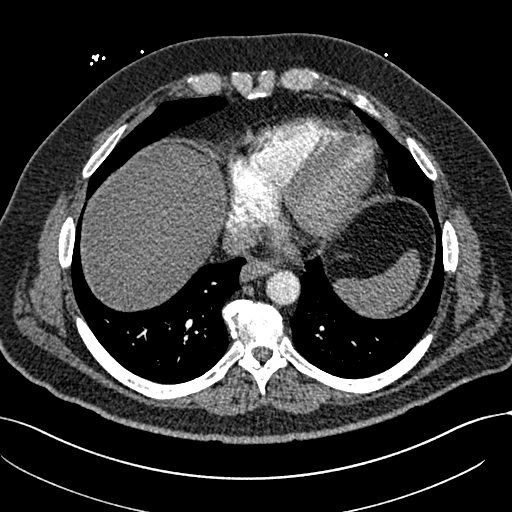
[im 127/293  lung]
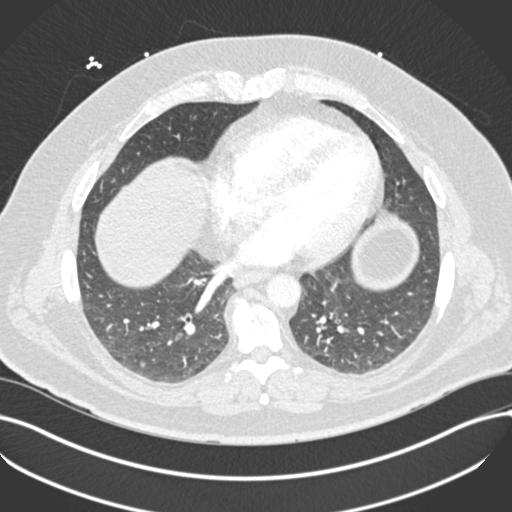
[im 153/293  soft-tissue]
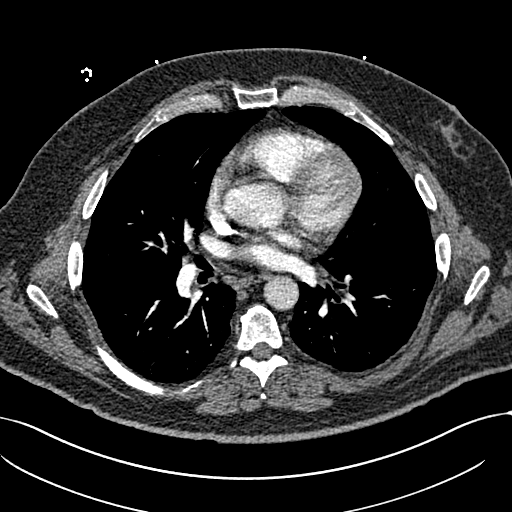
[im 166/293  lung]
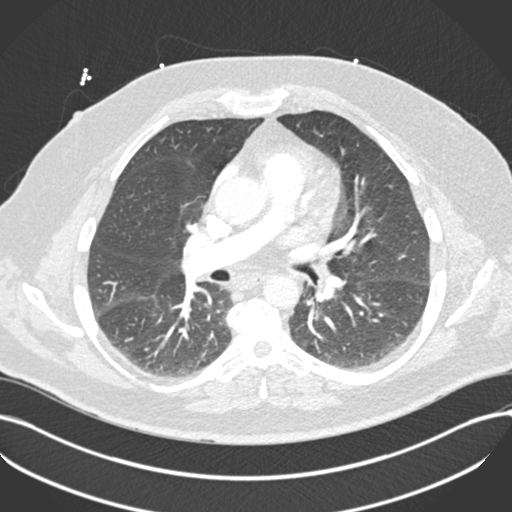
[im 178/293  soft-tissue]
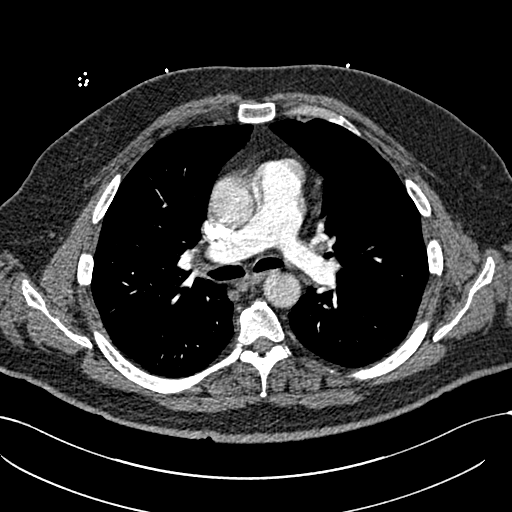
[im 204/293  lung]
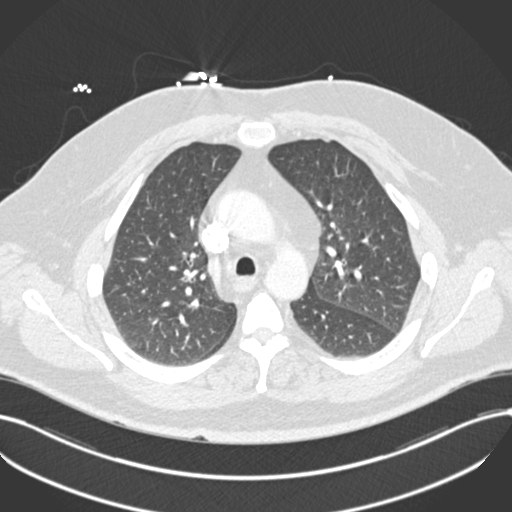
[im 216/293  soft-tissue]
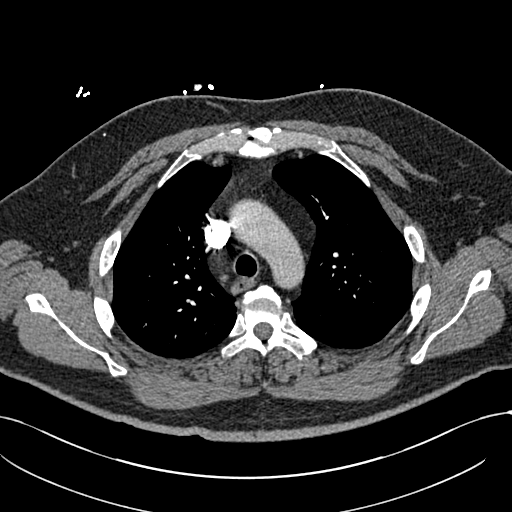
[im 242/293  lung]
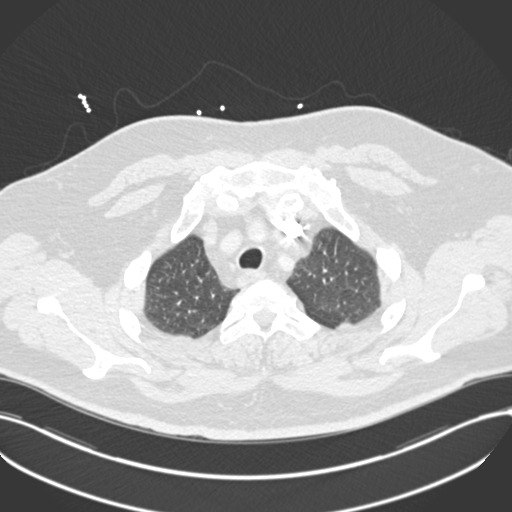
[im 254/293  soft-tissue]
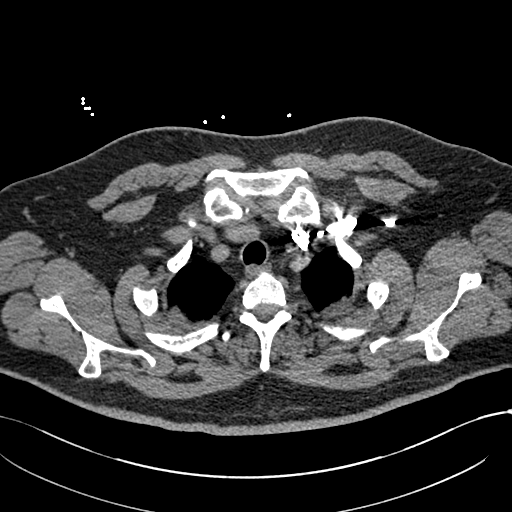
[im 280/293  lung]
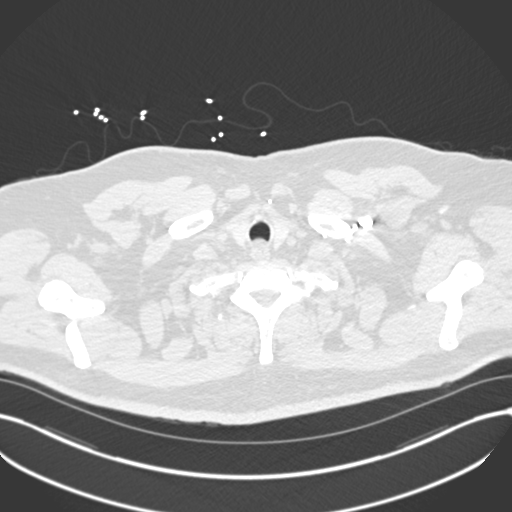

[Series 7: coronal mpr · coronal · 0.57mm/px · 3 of 103 slices shown]
[im 26/103  soft-tissue]
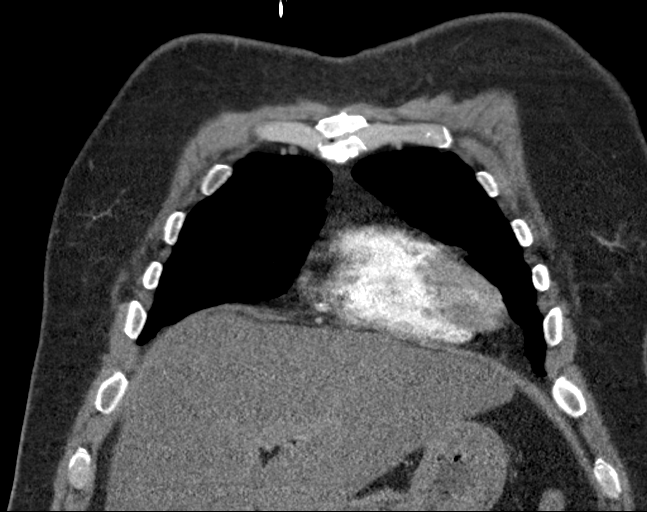
[im 52/103  soft-tissue]
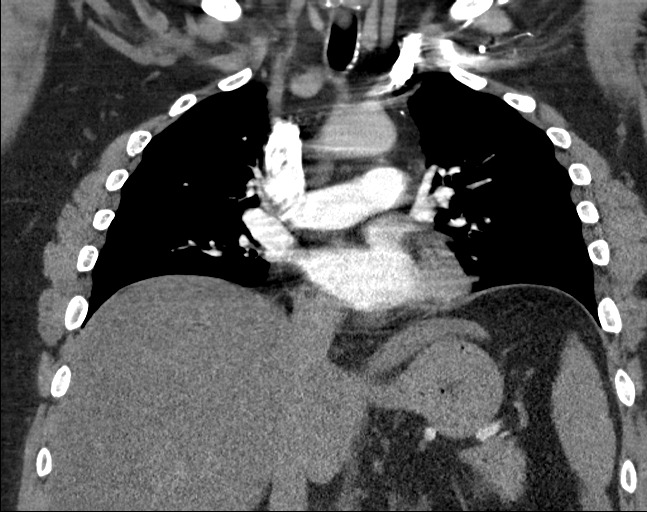
[im 77/103  soft-tissue]
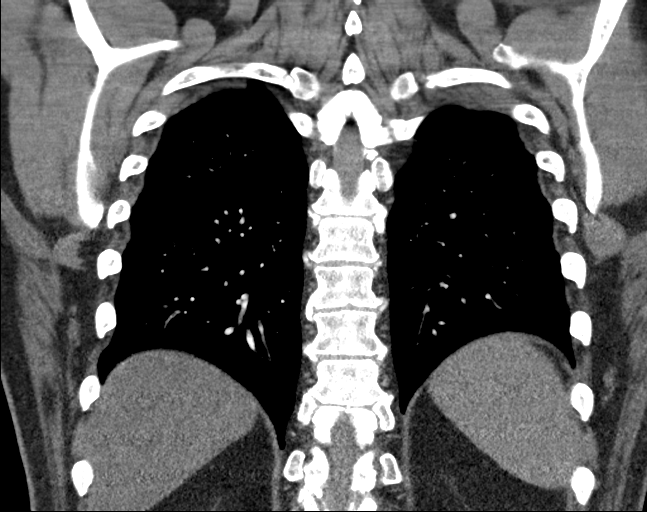

[18 of 46 positions shown; findings below may reference images not displayed]

FINDINGS: Cardiovascular: Satisfactory opacification of the pulmonary arteries
to the segmental level. No evidence of pulmonary embolism. Normal
heart size. No pericardial effusion.

Mediastinum/Nodes: Thyroid is normal in appearance. No mediastinal,
hilar, or axillary adenopathy. Esophagus is normal in appearance.

Lungs/Pleura: No consolidations, or pleural effusions. There is mild
smooth septal thickening distributed along the dependent aspects of
the lung, and associated with mild parenchymal ground-glass density,
consistent with mild pulmonary edema. Small calcified granulomata
are identified within the lungs.

Upper Abdomen: The liver is diffusely low attenuation. Prominent
caudate lobe. A left renal cyst is partially imaged. Gallbladder is
present. There is a 1.9 centimeter benign lipoma within the neck of
the pancreas.

Musculoskeletal: Significant midthoracic degenerative changes. No
evidence for acute abnormality.

Review of the MIP images confirms the above findings.
IMPRESSION: 1. Mild changes of interstitial pulmonary edema without pleural
effusions or alveolar infiltrates.
2. Technically adequate exam showing no acute pulmonary embolus.
3. Hepatic steatosis. Morphology of the liver compatible with
cirrhosis.
4. Left renal cyst.
5. Benign pancreatic lipoma.
